# Patient Record
Sex: Female | Born: 1965 | Race: Black or African American | Hispanic: No
Health system: Northeastern US, Academic
[De-identification: ages and names within clinical notes are randomized; demographics above are authoritative.]

---

## 2019-10-07 ENCOUNTER — Telehealth: Admit: 2019-10-07 | Payer: PRIVATE HEALTH INSURANCE | Attending: Urology | Primary: Internal Medicine

## 2019-10-07 LAB — CYTOLOGY NONGYN CASES     (Q)

## 2019-10-07 NOTE — Other
Please let patient know urine cytology is negative. Thank you

## 2019-10-07 NOTE — Telephone Encounter
Called to relay below result but there was no answer. Unable to LM as VM box is full. ----- Message from [name], APRN sent at 10/07/2019  5:12 PM EST -----Please let patient know urine cytology is negative. Thank you

## 2019-10-08 ENCOUNTER — Telehealth: Admit: 2019-10-08 | Payer: PRIVATE HEALTH INSURANCE | Attending: Urology | Primary: Internal Medicine

## 2019-10-08 NOTE — Telephone Encounter
No answer mailbox is full.  Will call back later

## 2019-10-16 ENCOUNTER — Inpatient Hospital Stay: Admit: 2019-10-16 | Discharge: 2019-10-16 | Payer: MEDICAID | Primary: Internal Medicine

## 2019-10-16 DIAGNOSIS — R3129 Other microscopic hematuria: Secondary | ICD-10-CM

## 2019-10-16 MED ORDER — SODIUM CHLORIDE 0.9 % BOLUS (NEW BAG)
0.9 % | Freq: Once | INTRAVENOUS | Status: CP
Start: 2019-10-16 — End: ?
  Administered 2019-10-16: 23:00:00 0.9 mL/h via INTRAVENOUS

## 2019-10-16 MED ORDER — IOHEXOL 350 MG IODINE/ML INTRAVENOUS SOLUTION
350 mg iodine/mL | Freq: Once | INTRAVENOUS | Status: CP | PRN
Start: 2019-10-16 — End: ?
  Administered 2019-10-16: 23:00:00 350 mL via INTRAVENOUS

## 2019-10-18 NOTE — Other
Please let patient know [place] negative. Thank you

## 2019-10-19 ENCOUNTER — Telehealth: Admit: 2019-10-19 | Payer: PRIVATE HEALTH INSURANCE | Attending: Urology | Primary: Internal Medicine

## 2019-10-19 NOTE — Telephone Encounter
[name] contacted and results relayed. She verbalized understanding.

## 2019-10-19 NOTE — Telephone Encounter
-----   Message from [name], APRN sent at 10/18/2019  9:31 AM EST -----Please let patient know [place] negative. Thank you

## 2019-10-19 NOTE — Telephone Encounter
No answer. Left nondescript message to call office back. Will also try again later.

## 2019-10-26 ENCOUNTER — Encounter: Admit: 2019-10-26 | Payer: PRIVATE HEALTH INSURANCE | Attending: Urology | Primary: Internal Medicine

## 2019-10-26 ENCOUNTER — Encounter: Admit: 2019-10-26 | Payer: PRIVATE HEALTH INSURANCE | Primary: Internal Medicine

## 2019-10-26 ENCOUNTER — Ambulatory Visit: Admit: 2019-10-26 | Payer: MEDICAID | Attending: Urology | Primary: Internal Medicine

## 2019-10-26 DIAGNOSIS — K219 Gastro-esophageal reflux disease without esophagitis: Secondary | ICD-10-CM

## 2019-10-26 DIAGNOSIS — Z1231 Encounter for screening mammogram for malignant neoplasm of breast: Secondary | ICD-10-CM

## 2019-10-26 DIAGNOSIS — R3129 Other microscopic hematuria: Secondary | ICD-10-CM

## 2019-10-27 DIAGNOSIS — N399 Disorder of urinary system, unspecified: Secondary | ICD-10-CM

## 2019-11-02 ENCOUNTER — Inpatient Hospital Stay: Admit: 2019-11-02 | Discharge: 2019-11-02 | Payer: MEDICAID | Primary: Internal Medicine

## 2019-11-02 ENCOUNTER — Encounter: Admit: 2019-11-02 | Payer: PRIVATE HEALTH INSURANCE | Primary: Internal Medicine

## 2019-11-02 DIAGNOSIS — Z1231 Encounter for screening mammogram for malignant neoplasm of breast: Secondary | ICD-10-CM

## 2019-11-02 NOTE — Progress Notes
On 10-26-2019 while rooming Michelle Barron when I asked the question if she felt safe at home she stated yes but then started crying. I asked her if she was okay, and if she needed to speak to someone like a Arts development officer.Michelle Barron  stated yes and that she was talking to someone when she lived in [place]. I reported it to Gray Bernhardt who will reach out to our Child psychotherapist.

## 2019-11-02 NOTE — Progress Notes
Follow-up Note Michelle Barron is a 54 y.o. female is here for follow up with established diagnosis microhematuria.Pt reports no gross hematuria or flank pain.  Previously followed by Urologist in [place].  Dr Moise Boring.  No records available.  Pt moved here in August to be closer to daughter and is feeling socially isolated.  She watches her 56 year old grandson while daughter works. Pt would prefer to live with her daughter. She lives alone. She feels safe at home just lonely  Urine cytology: negativeCT nephrogram: no stones no hydro. Mild urinary bladder wall thickening is nonspecific HPIMaria  reports that she has never smoked. She has never used smokeless tobacco. She reports that she does not drink alcohol. No history on file for drug.  Past Medical History: Diagnosis Date ? GERD (gastroesophageal reflux disease)  Past Surgical History: Procedure Laterality Date ? APPENDECTOMY  20 yrs No Known AllergiesReview Of SystemsReview of SystemsReview of SystemsGeneral: Patient denies fevers, chills, sweats, loss of appetite, fatigue, malaise. Eyes: Patient denies blurring, double vision, vision loss, eye pain, light sensitivity. Ears/Nose/Throat: Patient denies earache, ringing in ears, decreased hearing, nasal congestion, difficulty swallowing. Cardiovascular: Patient denies chest pains, palpitations, fainting spells, shortness of breath, ankle swelling.  Respiratory: Patient denies cough, wheezing.  Skin: Patient denies rash, itching, suspicious lesions. GI: Patient denies nausea, vomiting, diarrhea, constipation, change in bowel habits, abdominal pain, bloody or black stools.  GU: See HPI Musculoskeletal: Patient denies back pain, joint pain, joint swelling, muscle cramps, muscle weakness, stiffness, arthritis. Neurological: Patient denies transient paralysis, weakness, numbness, tingling sensation, seizures, tremors, headaches.  Psychiatric: Patient denies depression, anxiety, memory loss, suicidal thoughts, hallucinations, paranoia. Endocrine: Patient denies cold intolerance, heat intolerance, increased thirst, increased appetite, large quantities of urine. Heme/Lymphatic: Patient denies abnormal bruising, bleeding, enlarged lymph nodes. Allergy/Immunologic: Patient denies persistent infections, HIV exposures. Physical ExamBP 125/82 (Site: r a, Position: Sitting, Cuff Size: X-Large)  - Pulse 68  - Temp 97.1 ?F (36.2 ?C) (Temporal)  - Ht 5' 1 (1.549 m)  - Wt 84.4 kg  - SpO2 99%  - BMI 35.14 kg/m? Lab Results Component Value Date  UCOLOR yellow 10/26/2019  UGLUCOSE Negative 10/26/2019  UKETONE Negative 10/26/2019  USPECGRAVITY 1.020 10/26/2019  UPROTEIN Negative 10/26/2019  UNITRATES Negative 10/26/2019  UBLOOD 2+ 10/26/2019  ULEUKOCYTES Negative 10/26/2019    No results found for: PVRPOCTPhysical ExamPhysical Exam Constitutional: oriented to person, place, and time.  appears well-developed and well-nourished. HENT: Head: Normocephalic. Eyes: No scleral icterus. Cardiovascular: Normal rate.  Pulmonary/Chest: Effort normal. Abdominal: Soft. Musculoskeletal: Normal range of motion. Lymphadenopathy:    no cervical adenopathy. Neurological: alert and oriented to person, place, and time. Skin: Skin is warm and dry. Psychiatric: Has a normal mood and affect. His behavior is normal. Judgment and thought content normal. Assessment & Plan:Michelle Barron is a 54 y.o. female Encounter Diagnoses Name SNOMED [place](R) Primary? ? Disease of urinary tract DISORDER OF URINARY TRACT Yes ? Microhematuria MICROSCOPIC HEMATURIA  Needs cysto to complete microhematuria work upSocial work referral [name], APRN

## 2019-11-05 ENCOUNTER — Encounter: Admit: 2019-11-05 | Payer: PRIVATE HEALTH INSURANCE | Primary: Internal Medicine

## 2019-11-05 NOTE — Progress Notes
SOCIAL WORK NOTEPatient Name: Michelle Barron Record Number: [id number] Date of Birth: [birth year]Social Work Follow Up    Most Recent Value Document Type  Progress Note Reason for Encounter  Lacks Resources Intervention  Distress Assessment & Resolution Distress Assessment & Resolution  Clarifying & Identifying Source of Information  Patient Record Reviewed  No Level of Care  Ambulatory Identified Clinical/Disposition, Issues/Barriers:  Emotional Support Intervention(s)/Summary  15 minuets telephone contact with Michelle Barron to discuss referral for emotional supports. She expressed that she was unable at this time but will call me back when she is available. Collaboration with Treatment Team/Community Providers/Family:  Medical team Outcome  Resolved Handoff Required?  No Barriers to Discharge  no barriers identified Next Steps/Plan (including hand-off):  Social Work intervention is complete at this time. Signature:  Edd Fabian, LMSW Contact Information:  [phone]

## 2019-11-23 ENCOUNTER — Encounter: Admit: 2019-11-23 | Payer: PRIVATE HEALTH INSURANCE | Primary: Internal Medicine

## 2019-11-23 NOTE — Progress Notes
SOCIAL WORK NOTEPatient Name: Michelle Barron Record Number: [id number] Date of Birth: 12-20-1967Social Work Follow Up    Most Recent Value Document Type  Progress Note Reason for Encounter  Psycho-Social Distress Source of Information  Patient Record Reviewed  No Level of Care  Ambulatory Identified Clinical/Disposition, Issues/Barriers:  Social Work Supports Intervention(s)/Summary  15 minuets telephone contact with Michelle Barron to discuss referral for emotional supports. She expressed that she is unavailable at this time and was informed to contact me when she is available. Collaboration with Treatment Team/Community Providers/Family:  Medical Team Outcome  Resolved Handoff Required?  No Barriers to Discharge  no barriers identified Next Steps/Plan (including hand-off):  Second attempt to speak with Michelle Barron. Additional attempt will contact Michelle Barron. Signature:  Edd Fabian, LMSW Contact Information:  [phone]  '

## 2019-12-09 ENCOUNTER — Ambulatory Visit: Admit: 2019-12-09 | Payer: MEDICAID | Attending: Urology | Primary: Internal Medicine

## 2019-12-09 ENCOUNTER — Encounter: Admit: 2019-12-09 | Payer: PRIVATE HEALTH INSURANCE | Attending: Urology | Primary: Internal Medicine

## 2019-12-09 ENCOUNTER — Telehealth: Admit: 2019-12-09 | Payer: PRIVATE HEALTH INSURANCE | Attending: Urology | Primary: Internal Medicine

## 2019-12-09 ENCOUNTER — Encounter
Admit: 2019-12-09 | Payer: PRIVATE HEALTH INSURANCE | Attending: Vascular and Interventional Radiology | Primary: Internal Medicine

## 2019-12-09 DIAGNOSIS — K219 Gastro-esophageal reflux disease without esophagitis: Secondary | ICD-10-CM

## 2019-12-09 NOTE — Telephone Encounter
Michelle Barron was in today to see Dr Mariana Single and asked that I contact you to get a hold of her.  She received a call from you and you two were unable to connect she would like to speak with you Please call her at her above number Thank you She states afternoons are best to reach her after 3:00 pm

## 2019-12-09 NOTE — Progress Notes
Michelle Barron is here today for a cystoscopy.  Patient informed of procedure and questions answered.  Instructions for pre procedure given to patient.  Written consent and time out obtained.  Patient prepped according to protocol.  Tolerated procedure well.  VSS.  Post procedure instructions reviewed with patient.

## 2019-12-09 NOTE — Patient Instructions
Cystoscopy, Care AfterRefer to this sheet in the next few weeks. These instructions provide you with information about caring for yourself after your procedure. Your health care provider may also give you more specific instructions. Your treatment has been planned according to current medical practices, but problems sometimes occur. Call your health care provider if you have any problems or questions after your procedure.What can I expect after the procedure?After the procedure, it is common to have:Mild pain when you urinate. Pain should stop within a few minutes after you urinate. This may last for up to 1 week.A small amount of blood in your urine for several days.Feeling like you need to urinate but producing only a small amount of urine.Follow these instructions at home:MedicinesTake over-the-counter and prescription medicines only as told by your health care provider.If you were prescribed an antibiotic medicine, take it as told by your health care provider. Do not stop taking the antibiotic even if you start to feel better.General instructionsReturn to your normal activities as told by your health care provider. Ask your health care provider what activities are safe for you.Do not drive for 24 hours if you received a sedative.Watch for any blood in your urine. If the amount of blood in your urine increases, call your health care provider.Follow instructions from your health care provider about eating or drinking restrictions.If a tissue sample was removed for testing (biopsy) during your procedure, it is your responsibility to get your test results. Ask your health care provider or the department performing the test when your results will be ready.Drink enough fluid to keep your urine clear or pale yellow.Keep all follow-up visits as told by your health care provider. This is important.Contact a health care provider if:You have pain that gets worse or does not get better with medicine, especially pain when you urinate.You have difficulty urinating.Get help right away if:You have more blood in your urine.You have blood clots in your urine.You have abdominal pain.You have a fever or chills.You are unable to urinate.This information is not intended to replace advice given to you by your health care provider. Make sure you discuss any questions you have with your health care provider.Document Released: 03/02/2005 Document Revised: 01/19/2016 Document Reviewed: 11/03/2016Elsevier Interactive Patient Education ? 2019 Elsevier Inc.

## 2019-12-09 NOTE — Progress Notes
HPIMaria Barron is a 54 y.o. female is here for follow up with established diagnosis microhematuria seen by Fransico Michael.?Pt reports no gross hematuria or flank pain.  Previously followed by Urologist in [place].  Dr Moise Boring.  No records available.  Pt moved here in August to be closer to daughter and is feeling socially isolated.  She watches her 41 year old grandson while daughter works. Pt would prefer to live with her daughter. She lives alone. She feels safe at home just lonely  ?Urine cytology: negativeCT nephrogram: no stones no hydro. Mild urinary bladder wall thickening is nonspecific ??She has never smoked. She has never used smokeless tobacco. She reports that she does not drink alcohol. No history on file for drug. She is here for cystoscopyReferred by Dr Kennieth Francois Past Medical HistoryPast Medical History: Diagnosis Date ? GERD (gastroesophageal reflux disease)  Past Surgical HistoryPast Surgical History: Procedure Laterality Date ? APPENDECTOMY  20 yrs AllergiesNo Known AllergiesMedicationsOutpatient Encounter Medications as of 12/09/2019 Medication Sig Dispense Refill ? tenofovir disoproxil (VIREAD) 300 mg tablet TAKE 1 TABLET BY MOUTH EVERY DAY   ? amoxicillin (AMOXIL) 500 MG capsule Take 500 mg by mouth 3 (three) times daily.   ? oxyCODONE-acetaminophen (PERCOCET) 10-650 mg per tablet Take 1 tablet by mouth every 6 (six) hours as needed.   No facility-administered encounter medications on file as of 12/09/2019.   Social HistorySocial History Tobacco Use ? Smoking status: Never Smoker ? Smokeless tobacco: Never Used Substance Use Topics ? Alcohol use: No ? Drug use: Not on file  Family History History reviewed. No pertinent family history.Review of SystemsNo change since 3/1/21General: Patient denies fevers, chills, sweats, loss of appetite, fatigue, malaise. Eyes: Patient denies blurring, double vision, vision loss, eye pain, light sensitivity. Ears/Nose/Throat: Patient denies earache, ringing in ears, decreased hearing, nasal congestion, difficulty swallowing. Cardiovascular: Patient denies chest pains, palpitations, fainting spells, shortness of breath, ankle swelling.  Respiratory: Patient denies cough, wheezing.  Skin: Patient denies rash, itching, suspicious lesions. GI: Patient denies nausea, vomiting, diarrhea, constipation, change in bowel habits, abdominal pain, bloody or black stools.  GU: See HPI Musculoskeletal: Patient denies back pain, joint pain, joint swelling, muscle cramps, muscle weakness, stiffness, arthritis. Neurological: Patient denies transient paralysis, weakness, numbness, tingling sensation, seizures, tremors, headaches.  Psychiatric: Patient denies depression, anxiety, memory loss, suicidal thoughts, hallucinations, paranoia. Endocrine: Patient denies cold intolerance, heat intolerance, increased thirst, increased appetite, large quantities of urine. Heme/Lymphatic: Patient denies abnormal bruising, bleeding, enlarged lymph nodes. Allergy/Immunologic: Patient denies persistent infections, HIV exposures. Physical ExamTemp 97.8 ?F (36.6 ?C) (Temporal)  - Ht 5' 1 (1.549 m)  - Wt 82.6 kg  - BMI 34.39 kg/m? Constitutional: Oriented to person, place, and time. Appears well-developed and well-nourished. Head: Normocephalic and atraumatic. Pulmonary/Chest: Effort normal. No wheezing.GU: normal meatusMusculoskeletal: Normal range of motion. Neurological: alert and oriented to person, place, and time. No focal deficits are evident. Skin: Skin is dry. Psychiatric: Has a normal mood and affect. Behavior is normal. Judgment and thought content normal.CystoscopyA time out was performed verifying patient and procedure to be performed.  Antibiotics were discussed.  The history, physical findings and indications for the procedure were reviewed prior to the procedure. A surgical consent was obtained. Antibiotic: noneAnesthesia:  NoneProcedure:  Patient was placed in dorsal lithotomy position and a 17 french flexible cystoscope was placed.  Sterile water was used for irrigation.Residual urine:  MinimalUrethral Meatus: NormalUrethra: No abnormalities detected aside from bladder neck polypsBladder:  Bladder showed a few glomerulations.  No  evidence of tumor or stones.  Normal expansion. Retroflex exam is normalTrigone:  Both orifices identified and are normal in configuration and location.The cystoscope was removed.  The patient tolerated the procedure well. Lab Results Component Value Date  UCOLOR yellow 12/09/2019  UGLUCOSE Negative 12/09/2019  UKETONE Negative 12/09/2019  USPECGRAVITY 1.015 12/09/2019  UPROTEIN Negative 12/09/2019  UNITRATES Negative 12/09/2019  UBLOOD 1+ 12/09/2019  ULEUKOCYTES Negative 12/09/2019   No results found for: PVRPOCTAssessment & Plan: Michelle Barron is a 54 y.o. female with microhematuria and negative workup. She will be observed.Encounter Diagnoses Name SNOMED [place](R) Primary? ? Microhematuria MICROSCOPIC HEMATURIA Yes Orders Placed This Encounter Procedures ? POC urinalysis manual w/o scope Return in about 1 year (around 12/08/2020) for follow up with [facility].Patient will let us know if any new problems or symptoms arise in the interim.

## 2019-12-09 NOTE — Progress Notes
Time Out Documentation?	Procedure Name: cystoscopy?	Procedure Time: 2:46 PM ?	Team Members Present: Isabella Bowens, Su Monks, MD?	Time Out initiated after patient positioned & prior to beginning of procedure: Yes?	Name of Patient  & MRUN # or DOB stated and matches ID band or previously confirmed med record number: Yes?	Proceduralist states or confirms procedure to be performed: Yes?	Procedural consent is used to verify procedure performed  & matches pt identifiers:Yes?	Site of procedure(s) (with laterality or level) is topically marked per policy & visible after draping:N/A ?	Final check completed on all specimens: not applicable?	Serial number on device: [id number]

## 2019-12-10 DIAGNOSIS — R3129 Other microscopic hematuria: Secondary | ICD-10-CM

## 2019-12-15 ENCOUNTER — Encounter: Admit: 2019-12-15 | Payer: PRIVATE HEALTH INSURANCE | Primary: Internal Medicine

## 2019-12-15 NOTE — Progress Notes
SOCIAL WORK NOTEPatient Name: Antia Rahal Record Number: [id number] Date of Birth: 11-19-1967Social Work Follow Up    Most Recent Value Document Type  Progress Note Reason for Encounter  Psycho-Social Distress Intervention  Resource Needs Assessment & Referral Resource Needs Assessment & Referral  Community Resources Source of Information  Patient Record Reviewed  No Level of Care  Ambulatory Identified Clinical/Disposition, Issues/Barriers:  Emotional Supports Intervention(s)/Summary  15 minuets telephone contact with Mr. A[place]il to discuss emotional supports.She expressed that she would like to speak to someone to address her anxiety. I discussed a list of mental health providers and it will be mailed to her home. She was informed that she can call the providers to schedule an appointment. She expressed an appreciation for the support and the appreciation. Collaboration with Treatment Team/Community Providers/Family:  Medical Team. Outcome  Resolved Handoff Required?  No Barriers to Discharge  no barriers identified Next Steps/Plan (including hand-off):  Social Work intervention is complete Signature:  Edd Fabian, LMSW Contact Information:  [phone]

## 2020-01-02 ENCOUNTER — Inpatient Hospital Stay: Admit: 2020-01-02 | Discharge: 2020-01-03 | Payer: MEDICAID

## 2020-01-02 ENCOUNTER — Emergency Department: Admit: 2020-01-02 | Payer: PRIVATE HEALTH INSURANCE | Primary: Internal Medicine

## 2020-01-02 DIAGNOSIS — R110 Nausea: Secondary | ICD-10-CM

## 2020-01-02 DIAGNOSIS — R519 Headache, unspecified: Secondary | ICD-10-CM

## 2020-01-02 MED ORDER — SODIUM CHLORIDE 0.9 % BOLUS (NEW BAG)
0.9 % | INTRAVENOUS | Status: CP
Start: 2020-01-02 — End: ?
  Administered 2020-01-03: 01:00:00 0.9 mL/h via INTRAVENOUS

## 2020-01-02 MED ORDER — MECLIZINE 25 MG TABLET
25 mg | Freq: Once | ORAL | Status: CP
Start: 2020-01-02 — End: ?
  Administered 2020-01-03: 01:00:00 25 mg via ORAL

## 2020-01-03 ENCOUNTER — Encounter: Admit: 2020-01-03 | Payer: PRIVATE HEALTH INSURANCE | Attending: Psychiatry | Primary: Internal Medicine

## 2020-01-03 DIAGNOSIS — Z79899 Other long term (current) drug therapy: Secondary | ICD-10-CM

## 2020-01-03 DIAGNOSIS — K219 Gastro-esophageal reflux disease without esophagitis: Secondary | ICD-10-CM

## 2020-01-03 DIAGNOSIS — M25552 Pain in left hip: Secondary | ICD-10-CM

## 2020-01-03 DIAGNOSIS — R45851 Suicidal ideations: Secondary | ICD-10-CM

## 2020-01-03 DIAGNOSIS — R42 Dizziness and giddiness: Secondary | ICD-10-CM

## 2020-01-03 LAB — CBC WITH AUTO DIFFERENTIAL
BKR WAM ABSOLUTE IMMATURE GRANULOCYTES: 0 x 1000/ÂµL (ref 0.0–0.3)
BKR WAM ABSOLUTE LYMPHOCYTE COUNT: 4.4 x 1000/ÂµL — ABNORMAL HIGH (ref 1.0–4.0)
BKR WAM ABSOLUTE NRBC: 0 x 1000/ÂµL (ref 0.0–0.0)
BKR WAM ANALYZER ANC: 3.9 x 1000/ÂµL (ref 1.0–11.0)
BKR WAM BASOPHIL ABSOLUTE COUNT: 0.1 x 1000/ÂµL — ABNORMAL HIGH (ref 0.0–0.0)
BKR WAM BASOPHILS: 0.5 % (ref 0.0–4.0)
BKR WAM EOSINOPHIL ABSOLUTE COUNT: 0.2 x 1000/ÂµL (ref 0.0–1.0)
BKR WAM EOSINOPHILS: 2.5 % (ref 0.0–7.0)
BKR WAM HEMATOCRIT: 41.8 % (ref 37.0–52.0)
BKR WAM HEMOGLOBIN: 13.4 g/dL (ref 12.0–18.0)
BKR WAM IMMATURE GRANULOCYTES: 0.2 % (ref 0.0–3.0)
BKR WAM LYMPHOCYTES: 47.2 % (ref 8.0–49.0)
BKR WAM MCH (PG): 28.3 pg (ref 27.0–31.0)
BKR WAM MCHC: 32.1 g/dL (ref 31.0–36.0)
BKR WAM MCV: 88.2 fL (ref 78.0–94.0)
BKR WAM MONOCYTE ABSOLUTE COUNT: 0.7 x 1000/ÂµL (ref 0.0–2.0)
BKR WAM MONOCYTES: 7.9 % (ref 4.0–15.0)
BKR WAM MPV: 9.7 fL (ref 6.0–11.0)
BKR WAM NEUTROPHILS: 41.7 % (ref 37.0–84.0)
BKR WAM NUCLEATED RED BLOOD CELLS: 0 % (ref 0.0–1.0)
BKR WAM PLATELETS: 373 x1000/ÂµL (ref 140–440)
BKR WAM RDW-CV: 13.7 % (ref 11.5–14.5)
BKR WAM RED BLOOD CELL COUNT: 4.7 M/ÂµL (ref 3.8–5.9)
BKR WAM WHITE BLOOD CELL COUNT: 9.3 x1000/ÂµL (ref 4.0–10.0)

## 2020-01-03 LAB — BASIC METABOLIC PANEL
BKR ANION GAP: 12 (ref 7–17)
BKR BLOOD UREA NITROGEN: 12 mg/dL (ref 6–20)
BKR BUN / CREAT RATIO: 13.2 (ref 8.0–23.0)
BKR CALCIUM: 9.7 mg/dL (ref 8.8–10.2)
BKR CHLORIDE: 105 mmol/L (ref 98–107)
BKR CO2: 24 mmol/L (ref 20–30)
BKR CREATININE: 0.91 mg/dL (ref 0.40–1.30)
BKR EGFR (AFR AMER): >60 mL/min/{1.73_m2} (ref >60)
BKR EGFR (NON AFRICAN AMERICAN): >60 mL/min/{1.73_m2} (ref >60)
BKR GLUCOSE: 92 mg/dL (ref 70–100)
BKR POTASSIUM: 4.7 mmol/L (ref 3.3–5.1)
BKR SODIUM: 141 mmol/L (ref 136–144)

## 2020-01-03 NOTE — ED Notes
20:10 Pt here for vertigo, fell 2 days, left hip 7/10 pain. EKG done in triage.  Pt feeling nauseated today. Pt reports she has anxiety. 20:12 Provider at bedside. 20:30 Dr Jomarie Longs at bedside doing neuro exam. 20g IV placed RAC. Basic labs drawn and sent. IVF started.20:41 To XR, will move to room B9 upon return, all belongings on pt bed. 21:05 Pt back from XR tom room B9, sitter at bedside, security called for safety check. Pt advised of need for safety check, became tearful, sayingI'm ok now, I'm ok, it's ok, I don't need that. Explained that since she told us that several times, we needed to keep her safe. Pt acknowledged this. 21:30 Pt ambulated to BR. C/o slightly dizzy, steady gait. Stand-by assist. 21:45 Security dealing with other pts, now here for safety check, pt became very scared and tearful, kept saying, I didn't do anything wrong! Belongings placed in pt bag and in cabinet with label. Pt allowed to keep clothes on, no strings. Purse sweater and shoes in belongings bag. 21:55 IVF done. Awaiting psych. 22:55 Pt sitting quietly in room, unable to give urine sample now since pt just went to BR earlier. 23:20 Psych provider at bedside. 00:17 RN d/w psych provider pt was going to go to [place], but after talking to daughter, psych is dc'ing pt. Pt ready to go. Paperwork given. IV dc'd intact, belongings returned to pt. Orders for COVID swab and U-tox cancelled.00:23  Pt verbalized understanding, and ambulated w/o difficulty. VSS.

## 2020-01-03 NOTE — Other
HistoryChief Complaint Patient presents with ? Dizziness   Pt reprots dizziness when she bends over or turns her head quickly, no known hx of vertigo. States this has been going on for 4 days with intermittent headaches. REports feel anxious regarding these symptoms. Denies CP, SOB, fever/chills.  Ms. Michelle Barron is a 54 y.o. female w/ pmhx significant for Hep B, GERD, vision loss, depression, and anxiety presents to the ED due to dizziness x 4 day. Patient reports that she only experiences symptoms when she bends down or turns her head, episodes last minutes w/ variance. Patient reports she feels the room spins around her when the episodes occur, today she came to the ED as she now begun to experience nausea with these episodes. 2 days ago patient fell due to dizziness, landing on her left hip, she continues to experience 7/10 pain but is able to ambulate and weight bear. Patient denies having had these symptoms before. Patient reports medication adherence top tenofivir, recently started taking daily Vit D supplement. Patient denies fever, vomiting, chest pain, and difficulty breathing.During safety evaluation, patient became tearful when asked about SI, reported current SI, denies HI. Patient previously was on an anxiolytic and seeing a therapist regularly in [place], moved to the area 7 months ago and has not found a new provider.  Past Medical History: Diagnosis Date ? GERD (gastroesophageal reflux disease)  Past Surgical History: Procedure Laterality Date ? APPENDECTOMY  22 yrs No family history on file.Social History Socioeconomic History ? Marital status: Divorced   Spouse name: Not on file ? Number of children: Not on file ? Years of education: Not on file ? Highest education level: Not on file Tobacco Use ? Smoking status: Never Smoker ? Smokeless tobacco: Never Used Substance and Sexual Activity ? Alcohol use: No ED Other Social History ? E-cigarette status Never User  ? E-Cigarette Use Never User  E-cigarette/Vaping Substances ? Nicotine No  ? THC No  ? CBD No  ? Flavoring No  E-cigarette/Vaping Devices ? Disposable No  ? Pre-filled or Refillable Cartridge No  ? Refillable Tank No  ? Pre-filled Pod No  Review of Systems Constitutional: Negative for fever. HENT: Negative for ear pain, sore throat and tinnitus.  Eyes: Positive for visual disturbance.      Reports the room spins, denies double vision and blurry vision. Respiratory: Negative for chest tightness and shortness of breath.  Cardiovascular: Positive for palpitations. Negative for chest pain. Gastrointestinal: Positive for nausea. Negative for abdominal pain, constipation, diarrhea and vomiting. Genitourinary: Negative for dysuria. Musculoskeletal: Positive for arthralgias. Neurological: Positive for dizziness and headaches. Negative for syncope. Psychiatric/Behavioral: Positive for suicidal ideas. Negative for agitation. The patient is nervous/anxious.   Physical ExamED Triage Vitals [01/02/20 1919]BP: 130/85Pulse: (!) 95Pulse from  O2 sat: n/aResp: 16Temp: 98.2 ?F (36.8 ?C)Temp src: OralSpO2: 99 % BP 130/85  - Pulse (!) 95  - Temp 98.2 ?F (36.8 ?C) (Oral)  - Resp 16  - Wt 81.2 kg  - SpO2 99%  - BMI 33.82 kg/m? Physical ExamConstitutional:     General: She is not in acute distress.   Appearance: Normal appearance. HENT:    Head: Normocephalic and atraumatic.    Right Ear: Tympanic membrane, ear canal and external ear normal.    Left Ear: Tympanic membrane and ear canal normal. Eyes:    Extraocular Movements: Extraocular movements intact.    Right eye: No nystagmus.    Left eye: No nystagmus.    Pupils: Pupils are equal, round,  and reactive to light.    Comments: Arcus senilis bilaterally.  Neck:    Musculoskeletal: No neck rigidity or muscular tenderness. Cardiovascular:    Rate and Rhythm: Normal rate and regular rhythm.    Pulses: Normal pulses.    Heart sounds: Normal heart sounds. Pulmonary:    Effort: Pulmonary effort is normal.    Breath sounds: Normal breath sounds. Abdominal:    General: Abdomen is flat.    Palpations: Abdomen is soft.    Tenderness: There is no abdominal tenderness. Musculoskeletal:    Left hip: She exhibits decreased range of motion and tenderness. Skin:   General: Skin is warm and dry. Neurological:    General: No focal deficit present.    Mental Status: She is alert and oriented to person, place, and time. Psychiatric:       Mood and Affect: Affect is tearful.       Thought Content: Thought content includes suicidal ideation. Thought content does not include homicidal ideation.  Procedures ED COURSEPatient Reevaluation: Plan and Assessment Ms. Michelle Barron is a 54 y.o. female w/ pmhx significant for Hep B, GERD, vision loss, depression, and anxiety presents to the ED due to dizziness x 4 day. Current issues include dizziness, nausea, and current SI. Patient is tachycardic at 96 bpm, remains VSS, BP mildly elevated at 130/85, afebrile at 98.2 F, respirations stable at 16, stating 99%. Patient is tearful, in NAD.ZOX:[id number] vertigo: BPPV vs. vestibular neuritis vs. meniere disease Central vertigo : cerebral ischemia vs. cerebral hemorrhage vs. vestibular migraine Orthostatic hypotension vs. Dehydration vs. Metabolic abnormality vs. Arrhythmia Hip contusion vs. Fracture (unlikely as weightbearing)Active SIPlan is to obtain ECG, CBC, CMP, and x-ray ofd the left hip. Plan to perform Dix-Hallpike, if positive, will perform Epley maneuver.Will start on IVF and order meclizine. Once medically cleared, will have Psych consult.Case discussed with Dr. Corliss Parish and Sammuel Cooper, PA-CED Course7:45PMEKG WNL8:40PMMeclizine administered8:45PMPatient to radiology. Imaging insignificant for fx. 9:00PMDix-hallpike performed, positive, indicative of BPPV as dx. Eppley maneuver performed. Appears successful, will follow.10:00PMCBC insignificant in findings, CMP pending.Patient continues to fell well s/p Eppley maneuver. Educated patient that Vit D is likely not cause of new symptoms, is actually known to help prevent BPPV.10:25PMCMP insignificant in findings. Cleared for psych.10:30PMPatient continues to deny symptoms of vertigo since [facility] was performed.Patient progress: stableClinical Impressions as of Jan 01 2153 Dizziness Suicidal ideations  ED DispositionPatient medically cleared for psychiatric consultation in setting of SI.Signed:Jenevieve Kirschbaum Alphonzo Severance on MHB9:58PM5/03/2020

## 2020-01-03 NOTE — Other
[facility] EMERGENCY DEPARTMENT	Emergency Department Psychiatric Evaluation5/9/2021Location of Evaluation (ex: [facility], [facility] Crisis):  Michelle Barron is a 54 y.o., Divorced, female.PRESENTATION HISTORY Referred by:  SelfPatient seen in consultation at the request of:  Medical Team of [facility] ERTransported from:  HomeLegal Status on Arrival:  NoneSource of Information:  Patient and FamilyChief Complaint:  DepressionHPI:  Pt is a 54 yrs old with history of depression.  Moved from [facility] approximately 7 months ago.  Not connected with mental health treatment.  Came to ER bc of feeling dizzy.  Reported symptoms of depression and chronic/intermitted passive SI.  Said not to having passive SI in the past days because my daughter came from school to stay with me.  No history of suicide attempts. Interested in treatment.  REports depressive symptomatology is related with somatic complaints.  No HI.  No perceptual disturbances.Collateral Information:  Talked with her daughter Michelle Barron; [phone]).  No acute safety concerns  [place] Spaniel pt is sometimes depressed and confirmed this could be related with somatic complaints.  Confirmed his sister just came home with mother from [place] and is going to be with her.  Writer gave her number for [facility].Associated Signs and Symptoms:  No alcohol and no drug use.  Timing:  chronicSeverity:  mild and moderateModifying Factors:  Social IsolationAccess to Firearms:  NoREVIEW OF SYSTEMS Review of Systems Psychiatric/Behavioral: Positive for dysphoric mood and suicidal ideas. All other systems reviewed and are negative.OUTPATIENT MEDICATIONS Current Outpatient Medications Medication Sig ? amoxicillin Take 500 mg by mouth 3 (three) times daily. ? oxyCODONE-acetaminophen Take 1 tablet by mouth every 6 (six) hours as needed. ? tenofovir disoproxil TAKE 1 TABLET BY MOUTH EVERY DAY Medication Comments:  Not taking any psychiatric medicationsHEALTH ISSUES / MEDICAL HISTORY / ALLERGIES Past Medical History: Diagnosis Date ? GERD (gastroesophageal reflux disease)  OB History No obstetric history on file. Past Surgical History: Procedure Laterality Date ? APPENDECTOMY  20 yrs Patient has no known allergies.PSYCHIATRIC  TREATMENT HISTORY Psychiatric Treatment History ? Inpatient Hospitalization No  No data recordedSOCIAL HISTORY Social History Socioeconomic History ? Marital status: Divorced   Spouse name: Not on file ? Number of children: Not on file ? Years of education: Not on file ? Highest education level: Not on file Tobacco Use ? Smoking status: Never Smoker ? Smokeless tobacco: Never Used Substance and Sexual Activity ? Alcohol use: No (If you would like to add more detailed Developmental and Educational information, you can utilizethe Smartphrase PSYSOCHXADULT or PSYSOCHXPED within the History/Social Note section of thepatients chart to save it at the patient level, or add it directly here within this evaluation)SUBSTANCE ABUSE HISTORY FAMILY HISTORY No family history on file.LABORATORY/DIAGNOSTIC RESULTS Recent Results (from the past 24 hour(s)) EKG  Collection Time: 01/02/20  7:22 PM Result Value Ref Range  ECG - HEART RATE 81 bpm  ECG - QRS Interval 88 ms  ECG - QT Interval 376 ms  ECG - QTC Interval 437 ms  ECG - P Axis 33 deg  ECG - QRS Axis 21 deg  ECG - T Wave Axis 16 deg  ECG -- P-R Interval 149 msec  ECG - SEVERITY Normal ECG severity Basic metabolic panel  Collection Time: 01/02/20  8:32 PM Result Value Ref Range  Sodium 141 136 - 144 mmol/L  Potassium 4.7 3.3 - 5.1 mmol/L  Chloride 105 98 - 107 mmol/L  CO2 24 20 - 30 mmol/L  Anion Gap 12 7 - 17  Glucose 92 70 - 100 mg/dL  BUN 12 6 - 20 mg/dL  Creatinine 9.60 4.54 - 1.30 mg/dL  Calcium 9.7 8.8 - 09.8 mg/dL  BUN/Creatinine Ratio 11.9 8.0 - 23.0  eGFR (Afr Amer) >60 >60 mL/min/1.61m2  eGFR (NON African-American) >60 >60 mL/min/1.78m2 CBC auto differential  Collection Time: 01/02/20  8:32 PM Result Value Ref Range  WBC 9.3 4.0 - 10.0 x1000/?L  RBC 4.7 3.8 - 5.9 M/?L  Hemoglobin 13.4 12.0 - 18.0 g/dL  Hematocrit 14.7 82.9 - 52.0 %  MCV 88.2 78.0 - 94.0 fL  MCHC 32.1 31.0 - 36.0 g/dL  RDW-CV 56.2 13.0 - 86.5 %  Platelets 373 140 - 440 x1000/?L  MPV 9.7 6.0 - 11.0 fL  ANC (Abs Neutrophil Count) 3.9 1.0 - 11.0 x 1000/?L  Neutrophils 41.7 37.0 - 84.0 %  Lymphocytes 47.2 8.0 - 49.0 %  Absolute Lymphocyte Count 4.4 (H) 1.0 - 4.0 x 1000/?L  Monocytes 7.9 4.0 - 15.0 %  Monocyte Absolute Count 0.7 0.0 - 2.0 x 1000/?L  Eosinophils 2.5 0.0 - 7.0 %  Eosinophil Absolute Count 0.2 0.0 - 1.0 x 1000/?L  Basophil 0.5 0.0 - 4.0 %  Basophil Absolute Count 0.1 (H) 0.0 - 0.0 x 1000/?L  Immature Granulocytes 0.2 0.0 - 3.0 %  Absolute Immature Granulocyte Count 0.0 0.0 - 0.3 x 1000/?L  nRBC 0.0 0.0 - 1.0 %  Absolute nRBC 0.0 0.0 - 0.0 x 1000/?L  MCH 28.3 27.0 - 31.0 pg Relevant laboratory/medical data (from the past 24 hours) includes:  Pending Urine ToxPHYSICAL EXAM Vitals:  01/03/20 0029 BP: 137/87 Pulse: 82 Resp: 18 Temp: 98.1 ?F (36.7 ?C) Physical Exam Constitutional: She is oriented to person, place, and time. She appears well-nourished. HENT: Head: Normocephalic and atraumatic. Eyes: Pupils are equal, round, and reactive to light. Conjunctivae and EOM are normal. Neck: Normal range of motion. Neck supple. Cardiovascular: Normal rate, regular rhythm and normal heart sounds. Pulmonary/Chest: Effort normal and breath sounds normal. Abdominal: Soft. Bowel sounds are normal. Musculoskeletal: Normal range of motion. Neurological: She is alert and oriented to person, place, and time. Skin: Skin is warm. Nursing note and vitals reviewed.I have reviewed the physical exam/work-up performed by [facility] ERThe patient has been medically cleared by Medical Team of [facility] EXAMINATION General AppearanceHabitus:  HeavyGrooming:  FairMusculoskeletalStrength and Tone: Strength normalGait and Station: Stable posture and stable gaitPsychiatricAttitude: CooperativePsychomotor Behavior: No psychomotor activation or retardationSpeech: normal rate, volume and prosodyMood: CalmAffect: Congruent to reported moodThought Process: Coherent, logical, goal directedAssociations: NormalThought Content: NormalSuicidal Ideation: No current suicidal plan, ideation or intentHomicidal Ideation: No current homicidal ideation, plan or intentJudgment:  FairInsight:  FairCognitive EvaluationOrientation: Oriented to person, oriented to place and oriented to date/time Attention and Concentration:  Normal attention and concentrationMemory: Recall 3 out of 3 items in 5 minutesLanguage:  3 of 3 objectsFund of Knowledge:  NormalAbstract Reasoning: Normal capacity for abstract reasoningSAFETY AND RISK ASSESSMENT [place] - Suicide Severity Screen    Most Recent Value Have you wished you were dead or wished you could go to sleep and not wake up?  Yes Have you actually had any thoughts of killing yourself?   Yes Have you ever done anything, started to do anything, or prepared to do anything to end your life?   No [place] Suicide Risk Level  Low Risk  PSY RISK ASSESSMENT SAFE-T WITH C-SSRS  Reason for Assessment:  Psychiatric Emergency Department EvaluationC-SSRS:   WISH TO BE DEAD:  Yes  CURRENT SUICIDAL  THOUGHTS:  No  Have you ever done anything, started to do anything or prepared to do anything to end your life?:  NoSuicidal Ideation Intensity :   FREQUENCY:  Less than once a week  DURATION:  Less than 1 hour / Some of the time  CONTROLLABILITY:  Easily able to control thoughts  DETERRENTS:  Deterrents definitely stopped you from attempting suicide  REASONS FOR IDEATION:  Does not applyRisk Assessment:   Access to Lethal Methods:  NoCurrent and Past Psychiatric Diagnoses:    Mood Disorder:  Recurrent/Current  Psychotic Disorder:  No  Alcohol/Substance Abuse Disorders:  No  PTSD:  No  ADHD:  No  TBI:  No  Cluster Michelle Personality Disorders or Traits:  No  Conduct Problems:  No  Suicide Attempt:  No Prior Attempts  Presenting Symptoms:  Anhedonia:  Yes  Family History:   Suicide:  No  Suicidal Behavior:  No  Axis I Psychiatric Diagnosis requiring hospitalization:  No  Precipitants / Stressors:   Social Isolation:  Yes  Change in Treatment:    Recent inpatient discharge:  No  Not receiving treatment:  YesCurrent and Past Psychiatric Diagnoses:  Mood Disorder:  Recurrent/Current  Psychotic Disorder:  No  Alcohol/Substance Abuse Disorders:  No  PTSD:  No  ADHD:  No  TBI:  No  Cluster Michelle Personality Disorders or Traits:  No  Conduct Problems:  No  Suicide Attempt:  No Prior AttemptsProtective Factors:   Internal:   Fear of death or the actual act of killing self:  Yes  External:   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 - Self-Injurious Behavior:   Current Urges to harm Self:  No  Recent Self-Injury:  No  History of Self Injury:  No  Attitude Regarding Self Injury:  Ego dystonic  Imminent Risk for Self Injury in Community:  Low  Imminent Risk for Self Injury in Facility:  LowRisk to Others:   Current Agitation:  No  Homicidal/Aggressive Ideation:  No  Homicidal/Aggressive Threat/Plan:  No  Recent Violence/Aggression:  No  Attitude Regarding Aggression / Violence:  Ego dystonic  Imminent Risk for Violence in Community:  Low  Imminent Risk for Violence in Facility:  LowWithdrawal Risk:   Alcohol/benzo withdrawal risk factors: NO alcohol use.     Alcohol/Benzodiazepine/Barbiturate Risk for Withdrawal:  Absent  Opioids:  Not applicable     Opioid Risk for Withdrawal:  AbsentIMPRESSION No acute safety concernsPt without passive SI and without passive SI.  No HI.  No perceptual disturbances.  Does have symptoms of depression but is treatment oriented.  Symptoms could be related with isolation (I.e., immigrated from [place]).  Reported feeling pleased/happy bc his daughter just came from [facility] and will be with her for several weeks.  Collateral information without acute safety concerns.  Pt interested in [facility].  Pt also interested in being discharged.  No data supporting for Pt to be held against her will.  Gave IOP number to his daughter but also left message at [facility] referring her.  DIAGNOSTIC ASSESSMENT AND PLAN Active Hospital Problems  Diagnosis ? Depression [F32.9] GAF:  60 Legal Status Post Evaluation:  No Legal Status (Observation, Re-Evaluation, Continue on PEER or Discharge)Disposition Information/Plan:  Appropriate for outpatient treatment Dwaine Deter, MD05/09/21 1610

## 2020-01-03 NOTE — Discharge Instructions
Call [facility] 911 if ideas of harming yourself

## 2020-01-04 ENCOUNTER — Telehealth: Admit: 2020-01-04 | Payer: PRIVATE HEALTH INSURANCE | Attending: Clinical | Primary: Internal Medicine

## 2020-01-04 NOTE — Telephone Encounter
Rec'd referral to IOP from ED after hours.  This Clinical research associate spoke w/ Pt, discussed IOP.  Pt stated that she works full time and would be unable to participate in IOP.  Provided Pt with phone numbers of other mental health resources.  Pt expressed gratitude and reported she will follow up with them.

## 2020-01-08 NOTE — ED Provider Notes
HistoryChief Complaint Patient presents with ? Dizziness   Pt reprots dizziness when she bends over or turns her head quickly, no known hx of vertigo. States this has been going on for 4 days with intermittent headaches. REports feel anxious regarding these symptoms. Denies CP, SOB, fever/chills. ? Headache- New Onset Or New Symptoms ? Nausea ? Hip Pain ? Suicidal  The patient is a 54 y/o female with PMHx of GERD who presents to the ED for evaluation of intermittent dizziness x 4 days.  The patient reports that for the past 4 days if she bends over quickly or moves her head too fast she has seconds to minutes of a spinning sensation which then self-resolves.  Today she had associated nausea at 1 point which prompted her to finally present for evaluation.  She denies having any chest pain, shortness breath, abdominal pain, vomiting, diarrhea 1 week, fever, sweats, chills.  Of note she did fall during an incident 2 days prior to arrival and complains of pain in her left hip while ambulating.  She also reports she recently lost touch with her psychiatrist and has been feeling suicidal and thinking about killing herself. Denies HI, AH, VH. The history is provided by the patient. OtherThis is a new problem. The current episode started less than 1 hour ago. The problem occurs rarely. The problem has not changed since onset.Pertinent negatives include no chest pain, no abdominal pain, no headaches and no shortness of breath. Nothing aggravates the symptoms. Nothing relieves the symptoms. She has tried nothing for the symptoms. The treatment provided no relief.  Past Medical History: Diagnosis Date ? GERD (gastroesophageal reflux disease)  Past Surgical History: Procedure Laterality Date ? APPENDECTOMY  20 yrs No family history on file.Social History Socioeconomic History ? Marital status: Divorced   Spouse name: Not on file ? Number of children: Not on file ? Years of education: Not on file ? Highest education level: Not on file Tobacco Use ? Smoking status: Never Smoker ? Smokeless tobacco: Never Used Substance and Sexual Activity ? Alcohol use: No ED Other Social History ? E-cigarette status Never User  ? E-Cigarette Use Never User  E-cigarette/Vaping Substances ? Nicotine No  ? THC No  ? CBD No  ? Flavoring No  E-cigarette/Vaping Devices ? Disposable No  ? Pre-filled or Refillable Cartridge No  ? Refillable Tank No  ? Pre-filled Pod No  Review of Systems Constitutional: Negative for activity change, appetite change, chills, diaphoresis, fatigue, fever and unexpected weight change. HENT: Negative for congestion, sinus pain, sneezing and sore throat.  Respiratory: Negative for cough, chest tightness, shortness of breath and wheezing.  Cardiovascular: Negative for chest pain, palpitations and leg swelling. Gastrointestinal: Negative for abdominal distention, abdominal pain, anal bleeding, blood in stool, constipation, diarrhea, nausea and vomiting. Genitourinary: Negative for decreased urine volume, difficulty urinating, dysuria, flank pain, frequency, hematuria, pelvic pain and urgency. Musculoskeletal: Positive for arthralgias. Negative for back pain. Skin: Negative for color change, rash and wound. Neurological: Positive for dizziness. Negative for syncope, weakness, light-headedness, numbness and headaches. Psychiatric/Behavioral: Positive for suicidal ideas. All other systems reviewed and are negative. Physical ExamED Triage Vitals [01/02/20 1919]BP: 130/85Pulse: (!) 95Pulse from  O2 sat: n/aResp: 16Temp: 98.2 ?F (36.8 ?C)Temp src: OralSpO2: 99 % BP 137/87  - Pulse 82  - Temp 98.1 ?F (36.7 ?C) (Oral)  - Resp 18  - Wt 81.2 kg  - SpO2 100%  - BMI 33.82 kg/m? Physical ExamVitals signs and nursing note reviewed. Constitutional:  General: She is not in acute distress.   Appearance: She is well-developed. She is not diaphoretic. HENT:    Head: Normocephalic and atraumatic.    Nose: Nose normal. Eyes:    Conjunctiva/sclera: Conjunctivae normal. Neck:    Musculoskeletal: Normal range of motion and neck supple. Cardiovascular:    Rate and Rhythm: Normal rate and regular rhythm.    Heart sounds: Normal heart sounds. No murmur. No friction rub. No gallop.  Pulmonary:    Effort: Pulmonary effort is normal. No respiratory distress.    Breath sounds: Normal breath sounds. No wheezing or rales. Chest:    Chest wall: No tenderness. Abdominal:    General: Bowel sounds are normal. There is no distension.    Palpations: Abdomen is soft.    Tenderness: There is no abdominal tenderness. There is no guarding or rebound. Musculoskeletal:       General: No tenderness (Left hip) or deformity.    Comments: Able to ambulate. Sensation and distal pulses intact.  Skin:   General: Skin is warm and dry.    Capillary Refill: Capillary refill takes less than 2 seconds.    Findings: No rash. Neurological:    General: No focal deficit present.    Mental Status: She is alert and oriented to person, place, and time.    Comments: Dix-Hallpike maneuver with + resulting vertiginous symptoms that resolved per report within 15 seconds Psychiatric:       Mood and Affect: Mood normal.  ProceduresProceduresResident/APP ZOX:WRUEAVWUJW & PlanAssessment:? Mazella Deen  is a 54 y.o. female?with PMHx of GERD who presents to the ED for evaluation of intermittent dizziness x 4 days.?Exam as above, significant for?non-toxic but tearful 54 y/o female in NAD. Of note BP 130/85  - Pulse (!) 95  - Temp 98.2 ?F (36.8 ?C) (Oral)  - Resp 16  - Wt 81.2 kg  - SpO2 99%  - BMI 33.82 kg/m?  Cardiac RRR, lungs CTAB, abdomen soft, not TTP. Left hip TTP. Dix-Hallpike maneuver with + resulting vertiginous symptoms that resolved per report within 15 seconds?DDx:?vertigo/BPPV, orthostasis, anemia, dehydration, metabolic derangement, electrolyte disturbance, doubt CVA, contusion, doubt hip fx, suicidal?Plan: CBCBMPEKGUpregTrial meclizine/fluidsXray L hipClear for CIU?Results: -EKG unremarkable+Dix-Hallpike839 Patient received meds8:44 PM Patient to xray?XR Hip Left Final Result  Impression:    No acute fracture or dislocation.     Report Initiated By:  Alfonso Ellis, MD    Reported And Signed By: Madie Reno, MD Labs pending. Attempted epley maneuver, appears to have been successful, will continue to monitor. 9:49 PM CBC without anemia. Pending BMP. Cleared for psychiatric evaluation given reports of SI. ?Case discussed with Dr.Joseph??Sammuel Cooper, PA-C    --------------------------------------------------------D/W Attending Provider: Corliss Parish, MD [phone] acute issues/Distress2155 Signout Received. Awaiting BMP results - Clearance for CIU.BMP - No acute findingsCIU Eval - Medically cleared at this time for psych evaluation. No need for further medical workup at this time - No acute active medical issues. Needs psych eval due to reported SI.Tx to CIUChristopher Dill PA-C (([phone])ED COURSEPatient Reevaluation: ED Attestation: PA/APRNFace to face evaluation was performed by me in collaboration with the Advanced Practice Provider to assess for significant health threats.54 yo female here with vertigo.  Reports when she moves her head suddenly she experiences room spinning sensation that then resolves.  Had a fall yesterday as a result with some resulting left hip painOn my exam:Gen: alert, pleasant and appropriateNeuro: no facial asymmetry or focal deficits.  5/5 strength and SILT all 4 extrem.  No dysmetria/dysdiadochokinesiaHEENT: TMs  clear, neck supple, oropharynx clearCV: RRRResp: no resp distress or accessory muscle use.  Breathing comfortablyExt: WWP, mild pain with ROM of left hip.  Otherwise NTTP throughoutA/P:#Vertigo: c/w BPPV.  Plan epley, supportive care.  No exam e/o central vertigo#Hip pain: ambulatory.  Plan XR#SI - patient endorsed SI on ROS.  Will clear for CIU after above workup.  Daniel JosephClinical Impressions as of Jan 07 1746 Dizziness Suicidal ideations  ED DispositionDischarge - CIU Corliss Parish, MD05/08/21 2204 Otilio Carpen, PA05/08/21 2207 Isabel Caprice, PA05/14/21 1747

## 2020-04-27 ENCOUNTER — Ambulatory Visit: Admit: 2020-04-27 | Payer: PRIVATE HEALTH INSURANCE | Primary: Internal Medicine

## 2020-07-27 ENCOUNTER — Ambulatory Visit: Admit: 2020-07-27 | Payer: MEDICAID | Attending: Gynecology | Primary: Internal Medicine

## 2020-07-27 ENCOUNTER — Encounter: Admit: 2020-07-27 | Payer: PRIVATE HEALTH INSURANCE | Attending: Gynecology | Primary: Internal Medicine

## 2020-07-27 DIAGNOSIS — K219 Gastro-esophageal reflux disease without esophagitis: Secondary | ICD-10-CM

## 2020-07-27 DIAGNOSIS — Z01419 Encounter for gynecological examination (general) (routine) without abnormal findings: Secondary | ICD-10-CM

## 2020-07-27 DIAGNOSIS — N898 Other specified noninflammatory disorders of vagina: Secondary | ICD-10-CM

## 2020-07-27 MED ORDER — TERCONAZOLE 0.4 % VAGINAL CREAM
0.4 % | Freq: Every evening | VAGINAL | 2 refills | Status: AC
Start: 2020-07-27 — End: ?

## 2020-07-27 NOTE — Patient Instructions
DiverticulitisDiverticulitis is infection or inflammation of small pouches (diverticula) in the colon that form due to a condition called diverticulosis. Diverticula can trap stool (feces) and bacteria, causing infection and inflammation.Diverticulitis may cause severe stomach pain and diarrhea. It may lead to tissue damage in the colon that causes bleeding or blockage. The diverticula may also burst (rupture) and cause infected stool to enter other areas of the abdomen.What are the causes?This condition is caused by stool becoming trapped in the diverticula, which allows bacteria to grow in the diverticula. This leads to inflammation and infection.What increases the risk?You are more likely to develop this condition if you have diverticulosis. The risk increases if you:?	Are overweight or obese.?	Do not get enough exercise.?	Drink alcohol.?	Use tobacco products.?	Eat a diet that has a lot of red meat such as beef, pork, or lamb.?	Eat a diet that does not include enough fiber. High-fiber foods include fruits, vegetables, beans, nuts, and whole grains.?	Are over 87 years of age.What are the signs or symptoms?Symptoms of this condition may include:?	Pain and tenderness in the abdomen. The pain is normally located on the left side of the abdomen, but it may occur in other areas.?	Fever and chills.?	Nausea.?	Vomiting.?	Cramping.?	Bloating.?	Changes in bowel routines.?	Blood in your stool.How is this diagnosed?This condition is diagnosed based on:?	Your medical history.?	A physical exam.?	Tests to make sure there is nothing else causing your condition. These tests may include:?	Blood tests.?	Urine tests.?	[place] scan of the abdomen.How is this treated?Most cases of this condition are mild and can be treated at home. Treatment may include:?	Taking over-the-counter pain medicines.?	Following a clear liquid diet.?	Taking antibiotic medicines by mouth.?	Resting.More severe cases may need to be treated at a hospital. Treatment may include:?	Not eating or drinking.?	Taking prescription pain medicine.?	Receiving antibiotic medicines through an IV.?	Receiving fluids and nutrition through an IV.?	Surgery.When your condition is under control, your health care provider may recommend that you have a colonoscopy. This is an exam to look at the entire large intestine. During the exam, a lubricated, bendable tube is inserted into the anus and then passed into the rectum, colon, and other parts of the large intestine. A colonoscopy can show how severe your diverticula are and whether something else may be causing your symptoms.Follow these instructions at home:Medicines?	Take over-the-counter and prescription medicines only as told by your health care provider. These include fiber supplements, probiotics, and stool softeners.?	If you were prescribed an antibiotic medicine, take it as told by your health care provider. Do not stop taking the antibiotic even if you start to feel better.?	Ask your health care provider if the medicine prescribed to you requires you to avoid driving or using machinery.Eating and drinking?	Follow a full liquid diet or another diet as directed by your health care provider.?	After your symptoms improve, your health care provider may tell you to change your diet. He or she may recommend that you eat a diet that contains at least 25 grams (25 g) of fiber daily. Fiber makes it easier to pass stool. Healthy sources of fiber include:?	Berries. One cup contains 4-8 grams of fiber.?	Beans or lentils. One-half cup contains 5-8 grams of fiber.?	Green vegetables. One cup contains 4 grams of fiber.?	Avoid eating red meat.  General instructions?	Do not use any products that contain nicotine or tobacco, such as cigarettes, e-cigarettes, and chewing tobacco. If you need help quitting, ask your health care provider.?	Exercise for at least 30 minutes, 3 times each week. You should exercise hard enough to raise your heart rate and break a sweat.?	Keep all follow-up visits as told by your health care provider. This is important. You may need to have a colonoscopy.Contact a  health care provider if:?	Your pain does not improve.?	Your bowel movements do not return to normal.Get help right away if:?	Your pain gets worse.?	Your symptoms do not get better with treatment.?	Your symptoms suddenly get worse.?	You have a fever.?	You vomit more than one time.?	You have stools that are bloody, black, or tarry.Summary?	Diverticulitis is infection or inflammation of small pouches (diverticula) in the colon that form due to a condition called diverticulosis. Diverticula can trap stool (feces) and bacteria, causing infection and inflammation.?	You are at higher risk for this condition if you have diverticulosis and you eat a diet that does not include enough fiber.?	Most cases of this condition are mild and can be treated at home. More severe cases may need to be treated at a hospital.?	When your condition is under control, your health care provider may recommend that you have an exam called a colonoscopy. This exam can show how severe your diverticula are and whether something else may be causing your symptoms.Irritable Bowel Syndrome, AdultIrritable bowel syndrome (IBS) is a group of symptoms that affects the organs responsible for digestion (gastrointestinal or GI tract). IBS is not one specific disease.To regulate how the GI tract works, the body sends signals back and forth between the intestines and the brain. If you have IBS, there may be a problem with these signals. As a result, the GI tract does not function normally. The intestines may become more sensitive and overreact to certain things. This may be especially true when you eat certain foods or when you are under stress.There are four types of IBS. These may be determined based on the consistency of your stool (feces):IBS with diarrhea.IBS with constipation.Mixed IBS.Unsubtyped IBS.It is important to know which type of IBS you have. Certain treatments are more likely to be helpful for certain types of IBS.What are the causes?The exact cause of IBS is not known.What increases the risk?You may have a higher risk for IBS if you:Are female.Are younger than 67.Have a family history of IBS.Have a mental health condition, such as depression, anxiety, or post-traumatic stress disorder.Have had a bacterial infection of your GI tract.What are the signs or symptoms?Symptoms of IBS vary from person to person. The main symptom is abdominal pain or discomfort. Other symptoms usually include one or more of the following:Diarrhea, constipation, or both.Abdominal swelling or bloating.Feeling full after eating a small or regular-sized meal.Frequent gas.Mucus in the stool.A feeling of having more stool left after a bowel movement.Symptoms tend to come and go. They may be triggered by stress, mental health conditions, or certain foods.How is this diagnosed?This condition may be diagnosed based on a physical exam, your medical history, and your symptoms. You may have tests, such UJ:WJXBJ tests.Stool test.X-rays.[place] scan.Colonoscopy. This is a procedure in which your GI tract is viewed with a long, thin, flexible tube.How is this treated?There is no cure for IBS, but treatment can help relieve symptoms. Treatment depends on the type of IBS you have, and may include:Changes to your diet, such YN:WGNFAOZH foods that cause symptoms.Drinking more water.Following a low-FODMAP (fermentable oligosaccharides, disaccharides, monosaccharides, and polyols) diet for up to 6 weeks, or as told by your health care provider. FODMAPs are sugars that are hard for some people to digest.Eating more fiber.Eating medium-sized meals at the same times every day.Medicines. These may include:Fiber supplements, if you have constipation.Medicine to control diarrhea (antidiarrheal medicines).Medicine to help control muscle tightening (spasms) in your GI tract (antispasmodic medicines).Medicines to help with mental health conditions, such as antidepressants or tranquilizers.Talk therapy or counseling.Working with a diet and nutrition specialist (dietitian) to  help create a food plan that is right for you.Managing your stress.Follow these instructions at home:Eating and drinkingEat a healthy diet.Eat medium-sized meals at about the same time every day. Do not eat large meals.Gradually eat more fiber-rich foods. These include whole grains, fruits, and vegetables. This may be especially helpful if you have IBS with constipation.Eat a diet low in FODMAPs.Drink enough fluid to keep your urine pale yellow.Keep a journal of foods that seem to trigger symptoms.Avoid foods and drinks that:Contain added sugar.Make your symptoms worse. Dairy products, caffeinated drinks, and carbonated drinks can make symptoms worse for some people.General instructionsTake over-the-counter and prescription medicines and supplements only as told by your health care provider.Get enough exercise. Do at least 150 minutes of moderate-intensity exercise each week.Manage your stress. Getting enough sleep and exercise can help you manage stress.Keep all follow-up visits as told by your health care provider and therapist. This is important.Alcohol UseDo not drink alcohol BJ:YNWG health care provider tells you not to drink.You are pregnant, may be pregnant, or are planning to become pregnant.If you drink alcohol, limit how much you have:0-1 drink a day for women.0-2 drinks a day for men.Be aware of how much alcohol is in your drink. In the [place] one drink equals one typical bottle of beer (12 oz), one-half glass of wine (5 oz), or one shot of hard liquor (1? oz).Contact a health care provider if you have:Constant pain.Weight loss.Difficulty or pain when swallowing.Diarrhea that gets worse.Get help right away if you have:Severe abdominal pain.Fever.Diarrhea with symptoms of dehydration, such as dizziness or dry mouth.Bright red blood in your stool.Stool that is black and tarry.Abdominal swelling.Vomiting that does not stop.Blood in your vomit.SummaryIrritable bowel syndrome (IBS) is not one specific disease. It is a group of symptoms that affects digestion.Your intestines may become more sensitive and overreact to certain things. This may be especially true when you eat certain foods or when you are under stress.There is no cure for IBS, but treatment can help relieve symptoms.This information is not intended to replace advice given to you by your health care provider. Make sure you discuss any questions you have with your health care provider.Document Revised: 08/06/2017 Document Reviewed: 12/11/2018Elsevier Patient Education ? 2021 [employer].?	Keep all follow-up visits as told by your health care provider. This is important.This information is not intended to replace advice given to you by your health care provider. Make sure you discuss any questions you have with your health care provider.Document Revised: 05/25/2019 Document Reviewed: 09/28/2020Elsevier Patient Education ? 2021 [employer]DiverticulosisDiverticulosis is a condition that develops when small pouches (diverticula) form in the wall of the large intestine (colon). The colon is where water is absorbed and stool (feces) is formed. The pouches form when the inside layer of the colon pushes through weak spots in the outer layers of the colon. You may have a few pouches or many of them.The pouches usually do not cause problems unless they become inflamed or infected. When this happens, the condition is called diverticulitis.What are the causes?The cause of this condition is not known.What increases the risk?The following factors may make you more likely to develop this condition:?	Being older than age 19. Your risk for this condition increases with age. Diverticulosis is rare among people younger than age 51. By age 56, many people have it.?	Eating a low-fiber diet.?	Having frequent constipation.?	Being overweight.?	Not getting enough exercise.?	Smoking.?	Taking over-the-counter pain medicines, like aspirin and ibuprofen.?	Having a family history of diverticulosis.What are the signs or symptoms?In most people, there are no symptoms of this condition. If  you do have symptoms, they may include:?	Bloating.?	Cramps in the abdomen.?	Constipation or diarrhea.?	Pain in the lower left side of the abdomen.How is this diagnosed?Because diverticulosis usually has no symptoms, it is most often diagnosed during an exam for other colon problems. The condition may be diagnosed by:?	Using a flexible scope to examine the colon (colonoscopy).?	Taking an X-ray of the colon after dye has been put into the colon (barium enema).?	Having a [place] scan.How is this treated?You may not need treatment for this condition. Your health care provider may recommend treatment to prevent problems. You may need treatment if you have symptoms or if you previously had diverticulitis. Treatment may include:?	Eating a high-fiber diet.?	Taking a fiber supplement.?	Taking a live bacteria supplement (probiotic).?	Taking medicine to relax your colon.  Follow these instructions at home:Medicines?	Take over-the-counter and prescription medicines only as told by your health care provider.?	If told by your health care provider, take a fiber supplement or probiotic.Constipation preventionYour condition may cause constipation. To prevent or treat constipation, you may need to:?	Drink enough fluid to keep your urine pale yellow.?	Take over-the-counter or prescription medicines.?	Eat foods that are high in fiber, such as beans, whole grains, and fresh fruits and vegetables.?	Limit foods that are high in fat and processed sugars, such as fried or sweet foods.  General instructions?	Try not to strain when you have a bowel movement.?	Keep all follow-up visits as told by your health care provider. This is important.Contact a health care provider if you:?	Have pain in your abdomen.?	Have bloating.?	Have cramps.?	Have not had a bowel movement in 3 days.Get help right away if:?	Your pain gets worse.?	Your bloating becomes very bad.?	You have a fever or chills, and your symptoms suddenly get worse.?	You vomit.?	You have bowel movements that are bloody or black.?	You have bleeding from your rectum.Summary?	Diverticulosis is a condition that develops when small pouches (diverticula) form in the wall of the large intestine (colon).?	You may have a few pouches or many of them.?	This condition is most often diagnosed during an exam for other colon problems.?	Treatment may include increasing the fiber in your diet, taking supplements, or taking medicines.This information is not intended to replace advice given to you by your health care provider. Make sure you discuss any questions you have with your health care provider.Document Revised: 03/12/2019 Document Reviewed: 07/16/2020Elsevier Patient Education ? 2021 [employer]

## 2020-07-27 NOTE — Progress Notes
Michelle Barron is a 54 y.o. No obstetric history on file. female who presents to this practice for an annual exam.No LMP recorded. Patient is postmenopausal.Patient History?	Problem List: has Microhematuria and Depression on their problem list. ?	Past Surgical History: has a past surgical history that includes Appendectomy (20 yrs).?	Past Medical History: has a past medical history of GERD (gastroesophageal reflux disease).?	Family History: family history is not on file.?	Allergies:has No Known Allergies. Medications: Current Outpatient Medications: ?  amoxicillin, 500 mg, Oral, TID?  oxyCODONE-acetaminophen, 1 tablet, Oral, Q6H PRN?	?  tenofovir disoproxil, TAKE 1 TABLET BY MOUTH EVERY DAY ?	Social History: reports that she has never smoked. She has never used smokeless tobacco. She reports that she does not drink alcohol. ?	Intimate Partner Violence  No?	Menstrual/Sexual History: ?	Urology seen work up negative hematuria ?	Ob Hx G3P2 NSVD ?	Sexually active No?	Contraception N/A?	Dyspareunia Hx Deep inside ?	Cycles N/A?	Voids < 10?	Nocturia  5 x drinks ?	Menopausal Symptoms Hot Flashes ?	USI No ?	No Vulva issues ?	Vaginal itching at times ?	BM every other day pellet like stools?	ROS OB History No obstetric history on file. Objective: ?	BP 100/70  - Ht 5' 1 (1.549 m)  - Wt 80.9 kg  - Breastfeeding No  - BMI 33.71 kg/m?  Physical Exam?	General:  Appears well, no distress	?	Lungs: ?	Thyroid:normal to inspection and palpation?	Heart: ?	Breast Exam:Normal appearance, no masses or tenderness?	Abdomen: tender over Sigmoid colon 	?	External Genitalia: General appearance; normal?		?	Uterus Uterine size is 8 weeks and Uterus is  midposition?	        Vagina: Normal appearanceEstrogenized Kegel squeeze: 3/4	?	Cervix Normal			  ?	Adnexa: no palpable mass and no tenderness      Rectal: Health Maintenance: Colonoscopy:  Up to dateDexa Bone Density:  N/AMammogram:  Up to dateBreast [place]: N/APAP:  Completed todayAssessment / Plan: Michelle Barron is a 54 y.o. No obstetric history on file. femaleImp 1) LLG pain IBS/Divrerticulosis         2) s/p Right Eye Excision cyst 2013         3) Vaginal itchingPlan 1) f/u PCP /GI          2) Pap          3) Terazol 7 Patient Education:?	Specific topics reviewed: ?

## 2020-10-09 ENCOUNTER — Inpatient Hospital Stay: Admit: 2020-10-09 | Discharge: 2020-10-09 | Payer: MEDICAID

## 2020-10-09 ENCOUNTER — Emergency Department: Admit: 2020-10-09 | Payer: PRIVATE HEALTH INSURANCE | Primary: Internal Medicine

## 2020-10-09 ENCOUNTER — Emergency Department: Admit: 2020-10-09 | Payer: PRIVATE HEALTH INSURANCE | Attending: Diagnostic Radiology | Primary: Internal Medicine

## 2020-10-09 DIAGNOSIS — R0789 Other chest pain: Secondary | ICD-10-CM

## 2020-10-09 DIAGNOSIS — R079 Chest pain, unspecified: Secondary | ICD-10-CM

## 2020-10-09 DIAGNOSIS — R1011 Right upper quadrant pain: Secondary | ICD-10-CM

## 2020-10-09 DIAGNOSIS — R059 Cough, unspecified: Secondary | ICD-10-CM

## 2020-10-09 DIAGNOSIS — R112 Nausea with vomiting, unspecified: Secondary | ICD-10-CM

## 2020-10-09 LAB — CBC WITH AUTO DIFFERENTIAL
BKR WAM ABSOLUTE IMMATURE GRANULOCYTES.: 0.02 x 1000/ÂµL (ref 0.00–0.30)
BKR WAM ABSOLUTE LYMPHOCYTE COUNT.: 2 x 1000/ÂµL (ref 0.60–3.70)
BKR WAM ABSOLUTE NRBC (2 DEC): 0 x 1000/ÂµL (ref 0.00–1.00)
BKR WAM ANALYZER ANC: 5.86 x 1000/ÂµL (ref 2.00–7.60)
BKR WAM BASOPHIL ABSOLUTE COUNT.: 0.03 x 1000/ÂµL (ref 0.00–1.00)
BKR WAM BASOPHILS: 0.3 % (ref 0.0–1.4)
BKR WAM EOSINOPHIL ABSOLUTE COUNT.: 0.06 x 1000/ÂµL (ref 0.00–1.00)
BKR WAM EOSINOPHILS: 0.7 % (ref 0.0–5.0)
BKR WAM HEMATOCRIT (2 DEC): 40.9 % (ref 35.00–45.00)
BKR WAM HEMOGLOBIN: 12.7 g/dL (ref 11.7–15.5)
BKR WAM IMMATURE GRANULOCYTES: 0.2 % (ref 0.0–1.0)
BKR WAM LYMPHOCYTES: 23.2 % (ref 17.0–50.0)
BKR WAM MCH (PG): 27.9 pg (ref 27.0–33.0)
BKR WAM MCHC: 31.1 g/dL (ref 31.0–36.0)
BKR WAM MCV: 89.9 fL (ref 80.0–100.0)
BKR WAM MONOCYTE ABSOLUTE COUNT.: 0.65 x 1000/ÂµL (ref 0.00–1.00)
BKR WAM MONOCYTES: 7.5 % (ref 4.0–12.0)
BKR WAM MPV: 9.9 fL (ref 8.0–12.0)
BKR WAM NEUTROPHILS: 68.1 % (ref 39.0–72.0)
BKR WAM NUCLEATED RED BLOOD CELLS: 0 % (ref 0.0–1.0)
BKR WAM PLATELETS: 338 x1000/ÂµL (ref 150–420)
BKR WAM RDW-CV: 13.5 % (ref 11.0–15.0)
BKR WAM RED BLOOD CELL COUNT.: 4.55 M/ÂµL (ref 4.00–6.00)
BKR WAM WHITE BLOOD CELL COUNT: 8.6 x1000/ÂµL (ref 4.0–11.0)

## 2020-10-09 LAB — COMPREHENSIVE METABOLIC PANEL
BKR A/G RATIO: 1.7 (ref 1.0–2.2)
BKR ALANINE AMINOTRANSFERASE (ALT): 27 U/L (ref 10–35)
BKR ALBUMIN: 4.7 g/dL (ref 3.6–4.9)
BKR ALKALINE PHOSPHATASE: 71 U/L (ref 9–122)
BKR ANION GAP: 13 (ref 7–17)
BKR ASPARTATE AMINOTRANSFERASE (AST): 26 U/L (ref 10–35)
BKR AST/ALT RATIO: 1
BKR BILIRUBIN TOTAL: 0.4 mg/dL (ref <=1.2)
BKR BLOOD UREA NITROGEN: 10 mg/dL (ref 6–20)
BKR BUN / CREAT RATIO: 9.6 (ref 8.0–23.0)
BKR CALCIUM: 9.3 mg/dL (ref 8.8–10.2)
BKR CHLORIDE: 107 mmol/L (ref 98–107)
BKR CO2: 24 mmol/L (ref 20–30)
BKR CREATININE: 1.04 mg/dL (ref 0.40–1.30)
BKR EGFR (AFR AMER): >60 mL/min/{1.73_m2} (ref >60)
BKR EGFR (NON AFRICAN AMERICAN): 56 mL/min/{1.73_m2} (ref >60)
BKR GLOBULIN: 2.7 g/dL (ref 2.3–3.5)
BKR GLUCOSE: 110 mg/dL — ABNORMAL HIGH (ref 70–100)
BKR POTASSIUM: 4.1 mmol/L (ref 3.3–5.3)
BKR PROTEIN TOTAL: 7.4 g/dL (ref 6.6–8.7)
BKR SODIUM: 144 mmol/L (ref 136–144)

## 2020-10-09 LAB — URINALYSIS-MACROSCOPIC W/REFLEX MICROSCOPIC
BKR BILIRUBIN, UA: NEGATIVE
BKR GLUCOSE, UA: NEGATIVE
BKR KETONES, UA: NEGATIVE
BKR LEUKOCYTE ESTERASE, UA: NEGATIVE
BKR NITRITE, UA: NEGATIVE
BKR PH, UA: 7 (ref 5.5–7.5)
BKR PROTEIN, UA: NEGATIVE
BKR SPECIFIC GRAVITY, UA: 1.012 (ref 1.005–1.030)
BKR UROBILINOGEN, UA: <2 EU/dL (ref <=2.0)

## 2020-10-09 LAB — URINE MICROSCOPIC     (BH GH LMW YH)
BKR HYALINE CASTS, UA INSTRUMENT (NUMERIC): 1 /LPF (ref 0–3)
BKR RBC/HPF INSTRUMENT: 9 /HPF — ABNORMAL HIGH (ref 0–2)
BKR WBC/HPF INSTRUMENT: 1 /HPF (ref 0–5)

## 2020-10-09 LAB — TROPONIN T HIGH SENSITIVITY, 0 HOUR BASELINE WITH REFLEX (BH GH LMW YH): BKR TROPONIN T HS 0 HOUR BASELINE: <6 ng/L

## 2020-10-09 LAB — LIPASE: BKR LIPASE: 31 U/L (ref 11–55)

## 2020-10-09 MED ORDER — ALUMINUM-MAG HYDROXIDE-SIMETHICONE 200 MG-200 MG-20 MG/5 ML ORAL SUSP
200-200-20 mg/5 mL | Freq: Before meals | ORAL | Status: DC
Start: 2020-10-09 — End: 2020-10-10
  Administered 2020-10-09: 16:00:00 200-200-20 mL via ORAL

## 2020-10-09 MED ORDER — SODIUM CHLORIDE 0.9 % LARGE VOLUME SYRINGE FOR AUTOINJECTOR
Freq: Once | INTRAVENOUS | Status: CP | PRN
Start: 2020-10-09 — End: ?
  Administered 2020-10-09: 20:00:00 via INTRAVENOUS

## 2020-10-09 MED ORDER — POLYETHYLENE GLYCOL 3350 17 GRAM ORAL POWDER PACKET
17 gram | Freq: Every day | ORAL | 1 refills | Status: AC
Start: 2020-10-09 — End: ?

## 2020-10-09 MED ORDER — KETOROLAC 30 MG/ML (1 ML) INJECTION SOLUTION
30 mg/mL (1 mL) | Freq: Once | INTRAVENOUS | Status: CP
Start: 2020-10-09 — End: ?
  Administered 2020-10-09: 16:00:00 30 mL via INTRAVENOUS

## 2020-10-09 MED ORDER — IOHEXOL 350 MG IODINE/ML INTRAVENOUS SOLUTION
350 mg iodine/mL | Freq: Once | INTRAVENOUS | Status: CP | PRN
Start: 2020-10-09 — End: ?
  Administered 2020-10-09: 20:00:00 350 mL via INTRAVENOUS

## 2020-10-09 NOTE — ED Notes
9:26 AM 55 yo F presents to AED c/o right abd pain x 2 weeks. Pt reports being constipated which she thinks is causing abd pain. Has been taking miralax. Denies CP, SOB. Denies dysuria. Occassionally nauseas, denies vomiting. Denies f/c. A/Ox4. VSS on RA. Awaiting provider eval.Chief Complaint Patient presents with ? Abdominal Pain Past Medical History: Diagnosis Date ? GERD (gastroesophageal reflux disease)  10:18 AM EKG done. Labs collected. Med student performing bedside U/S. Awaiting urine sample.11:21 AM Meds given per MAR. 4:00 PM [place] completed. Awaiting dispo

## 2020-10-09 NOTE — ED Notes
9:19 AM Triage note: abdominal pain more on rt side x 2 weeks reports nausea denies vomiting/diarrhea, reports  feels constipated,last bm several days agoPt arrived to the ED with c/o ADB pain. Pt states she has had this intermittent adb for a long time.  Pt states that this current iteration began two weeks ago. Pt states some SOB/minor nausea; minor chills (last chronic always feels chills).  Pt no hx of respiratory ailments. Pt states has fatty liver. Pt states has Hep B.  Pt alert and oriented x 4. Pt able to talk in complete sentences without difficulty. Pt denies CP, V/D. Pt denies dizziness.  Pt denies urinary symptoms. 2:17 PMPt off to [place] scan.

## 2020-10-09 NOTE — ED Notes
10:40 AM Pt to imaging.

## 2020-10-11 NOTE — Telephone Encounter
ED Blue Follow Up message noted.  Spoke with Byrd Hesselbach and she was informed of the breast and liver findings and recommendations for f/u  She does have a provider at [address] [facility] and will call to secure a f/u appt. She was very appreciative of the f/u call.

## 2020-10-13 NOTE — ED Provider Notes
HistoryChief Complaint Patient presents with ? Abdominal Pain ? Chest Pain ? Cough  The history is provided by the patient. Abdominal PainPain location:  RUQ and R flankPain quality: dull  Pain radiates to:  Epigastric regionPain severity:  ModerateOnset quality:  GradualTiming:  IntermittentProgression:  Waxing and waningChronicity:  NewContext: not alcohol use  Relieved by:  NothingWorsened by:  NothingIneffective treatments:  None triedAssociated symptoms: chest pain and cough  Associated symptoms: no anorexia   Past Medical History: Diagnosis Date ? GERD (gastroesophageal reflux disease)  Past Surgical History: Procedure Laterality Date ? APPENDECTOMY  20 yrs No family history on file.Social History Socioeconomic History ? Marital status: Divorced   Spouse name: Not on file ? Number of children: Not on file ? Years of education: Not on file ? Highest education level: Not on file Tobacco Use ? Smoking status: Never Smoker ? Smokeless tobacco: Never Used Vaping Use ? Vaping Use: Never used Substance and Sexual Activity ? Alcohol use: No ED Other Social History ? E-cigarette status Never User  ? E-Cigarette Use Never User  E-cigarette/Vaping Substances ? Nicotine No  ? THC No  ? CBD No  ? Flavoring No  E-cigarette/Vaping Devices ? Disposable No  ? Pre-filled or Refillable Cartridge No  ? Refillable Tank No  ? Pre-filled Pod No  Review of Systems Respiratory: Positive for cough.  Cardiovascular: Positive for chest pain. Gastrointestinal: Positive for abdominal pain. Negative for anorexia. All other systems reviewed and are negative. Physical ExamED Triage Vitals [10/09/20 0909]BP: 129/85Pulse: (!) 92Pulse from  O2 sat: n/aResp: 18Temp: 97.1 ?F (36.2 ?C)Temp src: TemporalSpO2: 100 % BP 132/72  - Pulse 73  - Temp 98.2 ?F (36.8 ?C) (Oral)  - Resp 18  - SpO2 100% Physical ExamConstitutional:     Appearance: She is well-developed. HENT:    Head: Normocephalic and atraumatic.    Nose: Nose normal.    Mouth/Throat:    Mouth: Mucous membranes are moist. Eyes:    Pupils: Pupils are equal, round, and reactive to light. Cardiovascular:    Rate and Rhythm: Normal rate and regular rhythm. Pulmonary:    Effort: Pulmonary effort is normal. No respiratory distress.    Breath sounds: Normal breath sounds. Abdominal:    General: Bowel sounds are normal.    Palpations: Abdomen is soft.    Tenderness: There is abdominal tenderness in the right upper quadrant. There is no guarding or rebound. Musculoskeletal:       General: No tenderness. Normal range of motion.    Cervical back: Normal range of motion and neck supple.    Right lower leg: No edema.    Left lower leg: No edema. Skin:   General: Skin is warm and dry.    Findings: No rash. Neurological:    General: No focal deficit present.    Mental Status: She is alert. Psychiatric:       Mood and Affect: Mood normal.  ProceduresProcedures ED CourseClinical Impressions as of Oct 13 717 Abdominal pain, unspecified abdominal location  ED DispositionDischargeThis 55 year old female presents with chief complaint of right-sided abdominal discomfort which has been present for several weeks but worsening some.  She describes it as being in the right upper quadrant with some radiation to the flank.  She has some nausea without vomiting and does endorse some constipation.  She has also had some cough and states she has chest discomfort with the cough.  We will check labs EKG and a bedside ultrasound and re-evaluate.US unremarkable, [place] also negative. Will  d/c with medication for constipation and return precautions. Viona Gilmore, MD02/13/22 1028 Viona Gilmore, MD02/13/22 1656 Viona Gilmore, MD02/17/22 [id number]

## 2020-10-18 ENCOUNTER — Telehealth: Admit: 2020-10-18 | Payer: PRIVATE HEALTH INSURANCE | Primary: Internal Medicine

## 2020-10-18 NOTE — Telephone Encounter
She was seen in the ed They found a lump on left breastThere are no 40 min visits till April first weekShe would like to be seen sooner

## 2020-10-18 NOTE — Telephone Encounter
Pt given an appt with Dr Wardell Heath tomorrow 2/23

## 2020-10-19 ENCOUNTER — Ambulatory Visit: Admit: 2020-10-19 | Payer: MEDICAID | Attending: Gynecology | Primary: Internal Medicine

## 2020-10-19 DIAGNOSIS — R921 Mammographic calcification found on diagnostic imaging of breast: Secondary | ICD-10-CM

## 2020-10-19 NOTE — Progress Notes
GYN PROBLEM VISITHPI:Michelle Barron is a 55 y.o. G3P2 female who presents complaining of breast lumpNormal Mammogram 3/8/2021No LMP recorded (exact date). Patient is postmenopausal.Review of Symptoms:  Patient History?	Problem List: has Microhematuria and Depression on their problem list. ?	Past Surgical History: has a past surgical history that includes Appendectomy (20 yrs).?	Past Medical History: has a past medical history of GERD (gastroesophageal reflux disease).?	Allergies:has No Known Allergies. Medications: Current Outpatient Medications: ?  amoxicillin, 500 mg, Oral, TID?  oxyCODONE-acetaminophen, 1 tablet, Oral, Q6H PRN?  polyethylene glycol, 17 g, Oral, Daily?	?  tenofovir disoproxil, TAKE 1 TABLET BY MOUTH EVERY DAY ?	Social History:  reports that she has never smoked. She has never used smokeless tobacco. She reports that she does not drink alcohol.OB History Gravida Para Term Preterm AB Living 3 2         SAB TAB Ectopic Molar Multiple Live Births           2  # Outcome Date GA Lbr Len/2nd Weight Sex Delivery Anes PTL Lv 3 Gravida          2 Para          1 Para          Exam:?	BP 130/82  - Ht 5' 1 (1.549 m)  - Wt 77.7 kg  - LMP  (Exact Date)  - BMI 32.35 kg/m?   Last 3 weights 10/19/2020 09/21/2020 07/27/2020 Weight (lb) 171 lb 3.2 oz 176 lb 178 lb 6.4 oz Weight (kg) 77.656 kg 79.833 kg 80.922 kg  ?	?	General:  Appears well, No distress	      Breast No Masses or color changes noted No Nodes 	      Assessment & PlanImp 1) Left Breast Calcifications on [place] AB/PelvisPlan 1) Dx Mammogram and Ultrasound Exam Chaperoned by Vickii Penna

## 2020-10-27 ENCOUNTER — Encounter: Admit: 2020-10-27 | Payer: PRIVATE HEALTH INSURANCE | Attending: Gastroenterology | Primary: Internal Medicine

## 2020-10-27 DIAGNOSIS — B181 Chronic viral hepatitis B without delta-agent: Secondary | ICD-10-CM

## 2020-10-27 DIAGNOSIS — R932 Abnormal findings on diagnostic imaging of liver and biliary tract: Secondary | ICD-10-CM

## 2020-11-08 ENCOUNTER — Inpatient Hospital Stay: Admit: 2020-11-08 | Discharge: 2020-11-08 | Payer: MEDICAID | Primary: Internal Medicine

## 2020-11-08 DIAGNOSIS — R921 Mammographic calcification found on diagnostic imaging of breast: Secondary | ICD-10-CM

## 2020-12-05 ENCOUNTER — Inpatient Hospital Stay: Admit: 2020-12-05 | Discharge: 2020-12-05 | Payer: MEDICAID | Primary: Internal Medicine

## 2020-12-05 DIAGNOSIS — B181 Chronic viral hepatitis B without delta-agent: Secondary | ICD-10-CM

## 2020-12-05 DIAGNOSIS — R932 Abnormal findings on diagnostic imaging of liver and biliary tract: Secondary | ICD-10-CM

## 2020-12-05 MED ORDER — GADOTERATE MEGLUMINE 0.5 MMOL/ML (376.9 MG/ML) INTRAVENOUS SOLUTION
0.5 mmol/mL (376.9 mg/mL) | Freq: Once | INTRAVENOUS | Status: CP | PRN
Start: 2020-12-05 — End: ?
  Administered 2020-12-05: 22:00:00 0.5 mL via INTRAVENOUS

## 2020-12-08 ENCOUNTER — Encounter: Admit: 2020-12-08 | Payer: PRIVATE HEALTH INSURANCE | Attending: Urology | Primary: Internal Medicine

## 2020-12-08 ENCOUNTER — Ambulatory Visit: Admit: 2020-12-08 | Payer: MEDICAID | Attending: Urology | Primary: Internal Medicine

## 2020-12-08 DIAGNOSIS — K219 Gastro-esophageal reflux disease without esophagitis: Secondary | ICD-10-CM

## 2020-12-08 DIAGNOSIS — R3129 Other microscopic hematuria: Secondary | ICD-10-CM

## 2020-12-08 NOTE — Progress Notes
Follow-up Note Michelle Barron is a 55 y.o. female is here for follow up with established diagnosis microhematuria ?Pt reports no gross hematuria or flank pain.  Previously followed by Urologist in [place].  Dr Moise Boring.  No records available.  Pt moved here in August to be closer to daughter and is feeling socially isolated.  She watches her 67 year old grandson while daughter works. Pt would prefer to live with her daughter. She lives alone. She feels safe at home just lonely  .  Pt is working through this and feels things are better compared to previous visits?Urine cytology: negativeCT nephrogram: no stones no hydro. Mild urinary bladder wall thickening is nonspecificCystoscopy with Dr Mariana Single : showed a few glomerulations. No evidence of tumor or stones. Normal expansion. Retroflex exam normalIntermittent R flank discomfort.  Recent imaging reviewed:10/09/20 [place]: focal hyperenhancement in R liver lobe peripherally measuring up to 1.2 cm /recommend MRIMRI abdomen consistent with hepatic steatosis and liver hemangioma??HPIMaria  reports that she has never smoked. She has never used smokeless tobacco. She reports that she does not drink alcohol. No history on file for drug use.x  Past Medical History: Diagnosis Date ? GERD (gastroesophageal reflux disease)  Past Surgical History: Procedure Laterality Date ? APPENDECTOMY  20 yrs No Known AllergiesReview Of SystemsReview of SystemsReview of SystemsGeneral: Patient denies fevers, chills, sweats, loss of appetite, fatigue, malaise. Eyes: Patient denies blurring, double vision, vision loss, eye pain, light sensitivity. Ears/Nose/Throat: Patient denies earache, ringing in ears, decreased hearing, nasal congestion, difficulty swallowing. Cardiovascular: Patient denies chest pains, palpitations, fainting spells, shortness of breath, ankle swelling.  Respiratory: Patient denies cough, wheezing.  Skin: Patient denies rash, itching, suspicious lesions. GI: Patient denies nausea, vomiting, diarrhea, constipation, change in bowel habits, abdominal pain, bloody or black stools.  GU: Patient denies pain with urination, blood in urine, frequent urination, loss of bladder control, difficulty starting urination, frequent urination at night.  Musculoskeletal: Patient denies back pain, joint pain, joint swelling, muscle cramps, muscle weakness, stiffness, arthritis. Neurological: Patient denies transient paralysis, weakness, numbness, tingling sensation, seizures, tremors, headaches.  Psychiatric: Patient denies depression, anxiety, memory loss, suicidal thoughts, hallucinations, paranoia. Endocrine: Patient denies cold intolerance, heat intolerance, increased thirst, increased appetite, large quantities of urine. Heme/Lymphatic: Patient denies abnormal bruising, bleeding, enlarged lymph nodes. Allergy/Immunologic: Patient denies persistent infections, HIV exposures. Physical ExamHt 5' 1 (1.549 m)  - Wt 74.8 kg  - BMI 31.18 kg/m? Lab Results Component Value Date  UCOLOR yellow 12/08/2020  UGLUCOSE Negative 12/08/2020  UKETONE Negative 12/08/2020  USPECGRAVITY 1.030 12/08/2020  UPROTEIN Trace 12/08/2020  UNITRATES Negative 12/08/2020  UBLOOD Positive 12/08/2020  ULEUKOCYTES Negative 12/08/2020    No results found for: PVRPOCTPhysical ExamPhysical Exam Constitutional: oriented to person, place, and time.  appears well-developed and well-nourished. HENT: Head: Normocephalic. Eyes: No scleral icterus. Cardiovascular: Normal rate.  Pulmonary/Chest: Effort normal. Abdominal: Soft. Musculoskeletal: Normal range of motion. Lymphadenopathy:    no cervical adenopathy. Neurological: alert and oriented to person, place, and time. Skin: Skin is warm and dry. Psychiatric: Has a normal mood and affect. His behavior is normal. Judgment and thought content normal. Assessment & Plan:[name] is a 55 y.o. female Encounter Diagnoses Name SNOMED [place](R) Primary? ? Microhematuria MICROSCOPIC HEMATURIA Yes Microhematuria with negative work up Urine cytologyRTO in 6 mos/sooner gross hematuria/interim difficultyCynthia L Karsynn Deweese, APRN

## 2020-12-15 NOTE — Other
Urine cytology is negative.

## 2021-01-24 ENCOUNTER — Encounter: Admit: 2021-01-24 | Payer: PRIVATE HEALTH INSURANCE | Attending: Gastroenterology | Primary: Internal Medicine

## 2021-01-24 DIAGNOSIS — R1031 Right lower quadrant pain: Secondary | ICD-10-CM

## 2021-02-07 ENCOUNTER — Ambulatory Visit: Admit: 2021-02-07 | Payer: MEDICAID | Attending: Otolaryngology | Primary: Internal Medicine

## 2021-02-07 ENCOUNTER — Encounter: Admit: 2021-02-07 | Payer: PRIVATE HEALTH INSURANCE | Attending: Otolaryngology | Primary: Internal Medicine

## 2021-02-07 DIAGNOSIS — K219 Gastro-esophageal reflux disease without esophagitis: Secondary | ICD-10-CM

## 2021-02-07 DIAGNOSIS — G4733 Obstructive sleep apnea (adult) (pediatric): Secondary | ICD-10-CM

## 2021-02-07 NOTE — H&P
 - [facility]. Michelle Barron is a 55 y.o. female presenting to the [facility] for obstructive sleep apnea. Sleep study performed March 7th 2022 shows an AHI of 23, RDI 32 and lowest O2 sat of 80%. She has tried to using CPAP in the past with multiple mask types, with and without humidity, without being able to tolerate it. She was seen by pulmonology on April 6th 2022 who discussed repeat CPAP trial vs dental appliance vs surgery with ENT and she opted to be evaluated for surgery. She has a BMI of 31 and is a non-smoker. Past Medical History: Diagnosis Date ? GERD (gastroesophageal reflux disease)  Past Surgical History: Procedure Laterality Date ? APPENDECTOMY  20 yrs Prior to Admission medications  Medication Sig Start Date End Date Taking? Authorizing Provider tenofovir disoproxil (VIREAD) 300 mg tablet TAKE 1 TABLET BY MOUTH EVERY DAY 09/05/19  Yes Provider, Historical amoxicillin (AMOXIL) 500 MG capsule Take 500 mg by mouth 3 (three) times daily.Patient not taking: Reported on 02/07/2021    Provider, Historical oxyCODONE-acetaminophen (PERCOCET) 10-650 mg per tablet Take 1 tablet by mouth every 6 (six) hours as needed.Patient not taking: Reported on 02/07/2021    Provider, Historical polyethylene glycol (MIRALAX) 17 gram packet Take 1 packet (17 g total) by mouth daily. Mix in 8 ounces of water, juice, soda, coffee or tea prior to taking.Patient not taking: Reported on 02/07/2021 10/09/20   Viona Gilmore, MD No Known AllergiesBP 123/87  - Pulse (!) 97  - Temp 99.4 ?F (37.4 ?C)  - SpO2 100% Physical Exam:Constitutional: Well nourished in no acute distress. Breathing comfortably on room air. Responds appropriately to questioning and exam commands. Voice: Clear and strongHead and Face: There were no concerning cutaneous lesions of the head or face. Facial movements (CN VII) fully intact and symmetric. CN V1-V3 intact bilaterally. Eyes: Extraocular movements (CNIII, IV, VI) were intact with no nystagmus. No scleral jaundice. No periorbital edema or ecchymosis. No lagophthalmos or enophthalmos. Ears: The auricles were normal bilaterally. The external auditory canals were clear and the tympanic membranes were flat without any evidence of retraction or effusion.  Nose: Normal cartilaginous and bony dorsum.  Oral Cavity/Oroharynx: Tonsil size 1+ and Malampati 4 --> Friedman 4. Full tongue mobility (CN XII), soft floor of mouth, no concerning lesions of the oral tongue, floor of mouth, hard palate, attached gingiva, RMTs or lips. Symmetric palate rise. Neck/Lymphatic: No palpable lymph nodes or masses in the lateral neck or central compartment. No palpable thyroid masses. No palpable parotid or submandibular salivary gland masses. A/P: This is Michelle Barron 55 y.o. female with OSA who has failed CPAP in the recent past. Unfortunately, here oropharynx anatomy is not compatible with UPPP, and under insurance precludes her for [facility] referral at this time. - Referral placed to dentistry for oral cavity appliance trial- Return to ENT clinic as needed  Electronically Signed by [place] M Azilee Pirro, MD, February 07, 2021

## 2021-02-14 ENCOUNTER — Inpatient Hospital Stay: Admit: 2021-02-14 | Discharge: 2021-02-14 | Payer: MEDICAID | Primary: Internal Medicine

## 2021-02-14 DIAGNOSIS — R1031 Right lower quadrant pain: Secondary | ICD-10-CM

## 2021-02-14 MED ORDER — SODIUM CHLORIDE 0.9 % LARGE VOLUME SYRINGE FOR AUTOINJECTOR
Freq: Once | INTRAVENOUS | Status: CP | PRN
Start: 2021-02-14 — End: ?
  Administered 2021-02-14: 22:00:00 via INTRAVENOUS

## 2021-02-14 MED ORDER — IOHEXOL 350 MG IODINE/ML INTRAVENOUS SOLUTION
350 mg iodine/mL | Freq: Once | INTRAVENOUS | Status: CP | PRN
Start: 2021-02-14 — End: ?
  Administered 2021-02-14: 22:00:00 350 mL via INTRAVENOUS

## 2021-03-08 ENCOUNTER — Inpatient Hospital Stay: Admit: 2021-03-08 | Discharge: 2021-03-08 | Payer: MEDICAID | Attending: Emergency Medicine

## 2021-03-08 ENCOUNTER — Encounter: Admit: 2021-03-08 | Payer: PRIVATE HEALTH INSURANCE | Primary: Internal Medicine

## 2021-03-08 DIAGNOSIS — K219 Gastro-esophageal reflux disease without esophagitis: Secondary | ICD-10-CM

## 2021-03-08 NOTE — ED Notes
5:45 PM PT c/o of spitting blood in sink at 3am and then in the morning stopped and then at 2pm it started again. Pt unsure of whether it comes from stomach mouth or gums. When asked about suicidal thoughts pt is unable to speak and begins to cry and look away but allows RN to hold her hand and squeezes back. When asked suicidal thought from [place] screening exam pt quickly utters to not wake up and begins to cry harder pt supported and empathy provided pt offered her thank you. PT states she feel safe at home.

## 2021-03-09 NOTE — Plan of Care
Plan of Care Overview/ Patient Status    Emergency Room Psychiatric Plan of Care Plan of Care Note Type:  Initial Plan of CareSafety Intervention(s):  Assumed care of patient, Patient identification verified, Patient notified of video monitoring, Patient oriented to unit including bathroom, shower, phone, visiting policy and patient rights and Routine safety checks per unit policySupervision Intervention(s):  Standard/Routine per unit policyLegal Status Intervention(s):  Not yet determinedMedication Intervention(s):  Medications administered as orderedVital Signs Intervention(s):  Vital signs obtained as orderedLab/Procedure Intervention(s):  Lab(s) obtained as orderedDiet Intervention(s):  Meals, snacks and beverages offerredPain Intervention(s):  Pain assessedADL's Intervention(s):  Bathroom facilities offeredSleep Intervention(s):  Sleep and relaxation offered and promotedSupport Intervention(s):  Staff supportGoal - Patient verbalizes an understanding for requiring a psychiatric evaluation in the Emergency Department:  ActiveGoal - Patient utilizes self-discipline and coping strategies to reduce anxiety:  ActiveGoal - Patient verbalizes to staff if he/she feels unsafe and/or plans to hurt self/others:  ActiveShift DocumentationInitial. See ED Note.Elpidio Galea, RN07/13/228:41 PM

## 2021-03-09 NOTE — ED Notes
Psychiatric Emergency Nursing COVID Screening1) Has the patient had a health care professional recommend they receive a coronavirus test or do they have a test currently pending? (If yes, please describe)No / UnsureNo results found for: SARSCOV22) In the last 14 days have you been in contact with someone who was confirmed or suspected to have coronavirus? (If yes, please describe)No / Siri Cole) In the last 14 days has the patient traveled to or spent time in any of the following locations or facilities?Out of state travel (If yes, where?) Nursing home Group home / Adult home Shelter / [facility] No / Patsey Berthold) RN communication to Provider: N/AElectronically Signed by Elpidio Galea, RN, March 08, 2021

## 2021-03-09 NOTE — ED Notes
8:36 PM Report received, pt escorted to CIU by protective services and unit staff, safety check completed, belongings logged and secured, care assumed. Pt presents as tearful, inquiring about discharge. Denies SI/HI/AH/VH at this time. Psychiatric eval in progress. Safety checks in place. 9:42 PM Pt psychiatrically cleared for discharge, escorted off the unit by unit staff with all belongings. Discharged in private vehicle to home.

## 2021-03-09 NOTE — Other
[facility] [facility] EMERGENCY DEPARTMENT	Emergency Department Psychiatric Evaluation7/13/2022Location of Evaluation (ex: [facility], [facility] Crisis):  Michelle Barron is a 55 y.o. Divorced female.PRESENTATION HISTORY Referred by:  ED PhysicianPatient seen in consultation at the request of:  ED Medicine Attending Dr. Aggie Hacker from:  [facility] Status on Arrival:  NoneSource of Information:  Patient, Family and Chart/Previous RecordChief Complaint:  ' I'm fine' HPIThis is a 55 year old female with a past psychiatric hx of depressive disorder and a medical hx of GERD who presented to the ED for oral bleeding screened positive on CSSR and referred to psychiatry for an evaluation. Chart review reveals one prior ED visit last year for similar circumstances. Pt is tearful on evaluation. Pt reports feeling stressed out having to see doctors everyday for multiple medical issues. Reports that she is isolated and has no one to discuss her difficulties with. She lives with her younger daughter but feels that she is getting in the way of her living her life. Works as [employer] and does not have time to do many things. She reports that her sleep is disrupted due to sleep apnea. Pt notes that she sometimes feels that she wants to go sleep and not wake up. Denies every thinking of a plan or actual intent.  She states that she has been having a difficult time ever since she moved to [place] from [place] 2 years ago. She has been thinking about going to [place] where she has other relatives and a friend. Reports that she was someone weekly in the past which was helpful. She also notes difficulties with her memory. States that she has at times gone to the wrong pts and once left the stove on at home for tea and totally forgot about it. Was also on meds but that never really helped her. She is interested in connecting back to treatment to help her feel better. Referred to IOP after her last visit but could not make it work with her schedule. Spoke to pts daughter [name] who notes that pt lives with her younger sister. She reports that in terms of her mental heatlh pt has been doing better than she did last years. Denies any safety related concerns. Thinks that pts doctors need to do better in terms of getting to the bottom of her medical problems. Problem(s): Isolation, depressed mood, poor social supports, medical issuesTiming:  intermittentSeverity:  moderateAssociated Signs and Symptoms:  Poor social support. Modifying Factors:  not connected to treatmentAccess to Firearms:  Denies  REVIEW OF SYSTEMS Review of Systems Constitutional: Negative for appetite change and unexpected weight change. HENT: Negative.  Respiratory: Negative.  Gastrointestinal: Negative.  Genitourinary: Negative.  Musculoskeletal: Negative.  Psychiatric/Behavioral: Positive for dysphoric mood and sleep disturbance. The patient is nervous/anxious.  OUTPATIENT MEDICATIONS Current Outpatient Medications Medication Sig ? amoxicillin Take 500 mg by mouth 3 (three) times daily. (Patient not taking: Reported on 02/07/2021) ? chlorhexidine gluconate Swish 1 capful in mouth for 1 minute and spit out once a day in the evening and after brushing ? oxyCODONE-acetaminophen Take 1 tablet by mouth every 6 (six) hours as needed. (Patient not taking: Reported on 02/07/2021) ? polyethylene glycol Take 1 packet (17 g total) by mouth daily. Mix in 8 ounces of water, juice, soda, coffee or tea prior to taking. (Patient not taking: Reported on 02/07/2021) ? tenofovir disoproxil TAKE 1 TABLET BY MOUTH EVERY DAY Medication Comments: not on on psychiatric meds currently. HEALTH ISSUES / MEDICAL HISTORY / ALLERGIES Past Medical  History: Diagnosis Date ? GERD (gastroesophageal reflux disease)  OB History Gravida Para Term Preterm AB Living 3 2 SAB IAB Ectopic Molar Multiple Live Births           2  # Outcome Date GA Lbr Len/2nd Weight Sex Delivery Anes PTL Lv 3 Gravida          2 Para          1 Para          Past Surgical History: Procedure Laterality Date ? APPENDECTOMY  20 yrs Allergies: Patient has no known allergies.PSYCHIATRIC  TREATMENT HISTORY Psychiatric Treatment History ? Inpatient Hospitalization No  Current Psychiatric Providers (on file): No data recordedSOCIAL HISTORY Social History Socioeconomic History ? Marital status: Divorced Tobacco Use ? Smoking status: Never Smoker ? Smokeless tobacco: Never Used Vaping Use ? Vaping Use: Never used Substance and Sexual Activity ? Alcohol use: No SUBSTANCE ABUSE HISTORY Alcohol (quantity, last use): deniesOther substances (quantity, last use): deniesFAMILY HISTORY History reviewed. No pertinent family history.LABORATORY/DIAGNOSTIC RESULTS No results found for this or any previous visit (from the past 24 hour(s)).Relevant laboratory/medical data (from the past 24 hours) includes:  NAPHYSICAL EXAM Vitals:  03/08/21 1901 BP: 129/87 Pulse: 71 Resp: 18 Temp: 98.5 ?F (36.9 ?C) Physical ExamI have reviewed the physical exam/work-up performed by Dr. Reuben Likes.The patient has been medically cleared by Dr. Reuben Likes.PSYCHIATRIC EXAMINATION General AppearanceHabitus was medium. Height was short. MusculoskeletalGait was normal. Psychiatric ExaminationAttitude: cooperative Psychomotor Behavior: normal Speech: Rate: was slow.Volume: was soft.Prosody: normal Mood: fine Affect: Affective Quality was dysphoric, was anxious, was tearful.Affective Range: full range Affective Intensity: normal Thought Process:  Coherent Associations: normal Thought Content: no delusions or hallucinations Suicidal Ideation: had thoughts of deathViolent Ideation: no current violent/homicidal ideation, intent or plan Insight: fair Judgment: fair Cognitive EvaluationSensorium: alert Orientation: oriented to person, place and date Attention and Concentration:  WORLD backwards: unable / no effortSAFETY AND RISK ASSESSMENT [place] - Suicide Severity Screen  Flowsheet Row Most Recent Value Have you wished you were dead or wished you could go to sleep and not wake up? yes Have you actually had any thoughts of killing yourself? no Have you ever done anything, started to do anything, or prepared to do anything to end your life? no [place] Suicide Risk Level low risk  PSY RISK ASSESSMENT SAFE-T WITH C-SSRS  Reason for Assessment:  Psychiatric Emergency Department EvaluationC-SSRS:   WISH TO BE DEAD:  Yes  SUICIDAL THOUGHTS:  No  SUICIDAL THOUGHTS WITH METHOD:  No  SUICIDAL INTENT WITHOUT SPECIFIC PLAN:  No  Have you ever done anything, started to do anything or prepared to do anything to end your life?:  NoSuicidal Ideation Intensity :   FREQUENCY:  2-5 times a week  DURATION:  Fleeting - few seconds or minutes  CONTROLLABILITY:  Easily able to control thoughts  DETERRENTS:  Deterrents definitely stopped you from attempting suicide  REASONS FOR IDEATION:  Does not applyRisk Assessment:   Access to Lethal Methods:  NoCurrent and Past Psychiatric Diagnoses:    Mood Disorder:  Recurrent/Current  Suicide Attempt:  No Prior Attempts  Presenting Symptoms:  Anxiety and/or Panic:  Yes  Insomnia:  Yes  Precipitants / Stressors:   Social Isolation:  Yes  Perceived burden on others:  Yes  Stressful Life Events:  Yes  Change in Treatment:    Not receiving treatment:  YesProtective Factors:   Internal:   Ability to cope with stress:  Yes  Frustration tolerance:  No  Religious beliefs:  No  Fear of death or the actual act of killing self:  Yes  Identifies reasons for living:  Yes  Problem solving skills:  Yes  External:   Cultural, spiritual and/or moral attitudes against suicide:  Yes  Responsibility to children:  Yes  Beloved pets:  No  Supportive social network of friends or family:  No  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 - Self-Injurious Behavior:   Current Urges to harm Self:  No  Imminent Risk for Self Injury in Community:  Low  Imminent Risk for Self Injury in Facility:  LowRisk to Others:   Current Agitation:  No  Imminent Risk for Violence in Community:  Low  Imminent Risk for Violence in Facility:  LowWithdrawal Risk:   Alcohol / Benzodiazepines / Barbiturates:  Not applicable     Alcohol/Benzodiazepine/Barbiturate Risk for Withdrawal:  Absent  Opioids:  Not applicable     Opioid Risk for Withdrawal:  AbsentIMPRESSION / ASSESSMENT AND PLAN This is a 55 year old divorced F with a hx of depressive disorder who presented to the ED for dental bleeding who screened positive for on the CSSR and referred for evaluation. Pt notes depressed mood in the setting of increased medical issues, social isolation. Pt notes that her life is not what she had imagined it to be and feels sad. She reports getting anxious as well. Has difficulty with sleep sometimes wishes that she would go to sleep and not wake up. Not connected with treatment. Collateral from daughter reassuring. Pt denies intent or plan. She is future oriented and treatment seeking.Problem List:Active Hospital Problems  Diagnosis ? Depression [F32.A] The patient was assessed for suicide risk specifically and found to be at LOW risk in the community for suicide.  The patient's risk in the facility is assessed to be  MINIMAL.  Concerning acute risk factors include depressed mood, social isolation, lack of connection to mental health treatment.  The patient has strong protective factors which include future orientation, treatment seeking, engaged in work, supportive family. Suicide Risk is based on the definitions described by the [facility] and [facility]'s screening tool, the Suicide Assessment Five-step Evaluation and Triage (SAFE-T) for Mental Health Professionals (see: SAFE_T.pdf).  Based on this assessment, the patient will be: discharged.Legal Status Post Evaluation:  No Legal Status (Observation, Re-Evaluation, Continue on PEER or Discharge)Additional Disposition Information/Plan:  - discharge- Information of treatment in the community given. - recommended to call 211/911 if feeling unsafe.  Loran Senters Cylinder, MD07/13/22 2143

## 2021-03-09 NOTE — Discharge Instructions
Please followup with [facility]istry [facility] as recommended. Please call [facility] to establish MH servicesI have also identified a portuguese speaking therapist that you may connect.

## 2021-03-09 NOTE — ED Notes
7:20 PM Verbal report received from Fleet Contras, [place], care assumed. Pt awaiting admitting bed in CIU. 8:29 PMPt to CIU, care deferred.

## 2021-03-17 NOTE — ED Provider Notes
--------------------------------------------------------------------------------------------------------------------------------Medical Decision Making:Pt is a 55 y.o. female w/ PMH of GERD who presents with bleeding from mouth.  Patient reports that she woke up this morning at 3:00 a.m. and noticed that she had blood in her mouth which she spent in nursing.  She reports that it was relatively small amount of blood and she did not have any difficulty breathing.  She reports that this began last night has not ever happened before.  Of note, patient was seen at an outpatient dentist for assessment of a bridge on her left upper mouth and found to possibly have infection for which she completed a course oral antibiotics.  She denies neck pain or fever.  She does endorse some occasional epigastric pain that comes and goes and is not associated with activity.  She has been maintaining p.o. intake.  No nausea or vomiting.  Review systems is also negative for chest pain cough diarrhea or dysuria.  Of note, patient does report to myself and nursing that she has thoughts of not wanting to wake up because she believes that no one wants to help her because she has been sent to multiple dentists with inability to manage her Bridge issue due to insurance complications.PE findings:BP 129/87  - Pulse 71  - Temp 98.5 ?F (36.9 ?C) (Oral)  - Resp 18  - SpO2 100% Patient is hypertensive, afebrile, heart rate is normal, does not appear to be in respiratory distress, alert and oriented, no gross neurological deficitsPlan: ED Course: Evaluated with Dr. Irven Baltimore, MD, MPHEmergency Medicine ResidentAvailable through MHB7/22/2022 5:55 PMThis note may have been generated using M*Modal voice dictation software please excuse any typographical errors due to flaws in voice recognition.This encounter occurred within the context of the Covid-19 pandemic. --------------------------------------------------------------------------------------------------------------------------------HistoryChief Complaint Patient presents with ? Medical Problem   Pt reports having intermittent episodes of blood in her mouth. Pt also reports abdominal discomfort. Pt currently has no blood in mouth. Airway intact. + nausea. ? Abdominal Pain  The history is provided by the patient and medical records. IllnessThis is a new problem. The current episode started 12 to 24 hours ago. The problem occurs constantly. The problem has not changed since onset.Pertinent negatives include no chest pain.  Past Medical History: Diagnosis Date ? GERD (gastroesophageal reflux disease)  Past Surgical History: Procedure Laterality Date ? APPENDECTOMY  20 yrs History reviewed. No pertinent family history.Social History Socioeconomic History ? Marital status: Divorced Tobacco Use ? Smoking status: Never Smoker ? Smokeless tobacco: Never Used Vaping Use ? Vaping Use: Never used Substance and Sexual Activity ? Alcohol use: No ED Other Social History ? E-cigarette status Never User  ? E-Cigarette Use Never User  E-cigarette/Vaping Substances ? Nicotine No  ? THC No  ? CBD No  ? Flavoring No  E-cigarette/Vaping Devices ? Disposable No  ? Pre-filled or Refillable Cartridge No  ? Refillable Tank No  ? Pre-filled Pod No  Review of Systems Cardiovascular: Negative for chest pain. All other systems reviewed and are negative. Physical ExamED Triage Vitals [03/08/21 1609]BP: (!) 145/84Pulse: (!) 98Pulse from  O2 sat: n/aResp: 18Temp: 98.3 ?F (36.8 ?C)Temp src: n/aSpO2: 100 % BP 129/87  - Pulse 71  - Temp 98.5 ?F (36.9 ?C) (Oral)  - Resp 18  - SpO2 100% Physical ExamVitals and nursing note reviewed. Constitutional:     Appearance: Normal appearance. HENT:    Head: Normocephalic. Eyes:    Extraocular Movements: Extraocular movements intact.    Pupils: Pupils are equal, round, and reactive to  light. Cardiovascular:    Rate and Rhythm: Normal rate.    Pulses: Normal pulses. Pulmonary:    Effort: Pulmonary effort is normal. Abdominal:    General: Abdomen is flat.    Palpations: Abdomen is soft. Skin:   General: Skin is warm and dry. Neurological:    General: No focal deficit present.    Mental Status: She is alert and oriented to person, place, and time. Psychiatric:       Mood and Affect: Mood normal.       Behavior: Behavior normal.  ProceduresProcedures ED COURSEPatient Reevaluation: Attending Supervised: ResidentI saw and examined the patient. I agree with the findings and plan of care as documented in the resident's note. Chastin Garlitz is a 55 y.o. female who presents to the Emergency Department complaining of increased feelings of depression and passive suicidal ideation in setting of frustration over dental issue.  She reports the lack of insurance impacting ability to get dental care.  Notes intermittent bleeding from site of prior dental work.No abdominal pain.No nausea, vomiting, diarrhea.Vital stable, SaO2 preserved.No active hemorrhage from oral cavity.  Site around right upper molar with evidence of recent bleed.No evidence of periapical abscess.Plan:Establish dental follow-up in our clinic.Psychiatry consult for suicidal ideation.  Nada Libman SatherClinical Impressions as of 03/17/21 0507 Depression, unspecified depression type  ED DispositionDischarge Inez Pilgrim, MD07/13/22 1836 Inez Pilgrim, MD07/22/22 [phone]

## 2021-06-12 ENCOUNTER — Ambulatory Visit: Admit: 2021-06-12 | Payer: PRIVATE HEALTH INSURANCE | Attending: Urology | Primary: Internal Medicine

## 2021-07-17 ENCOUNTER — Encounter: Admit: 2021-07-17 | Payer: PRIVATE HEALTH INSURANCE | Attending: Urology | Primary: Internal Medicine

## 2021-07-17 ENCOUNTER — Ambulatory Visit: Admit: 2021-07-17 | Payer: MEDICAID | Attending: Urology | Primary: Internal Medicine

## 2021-07-17 DIAGNOSIS — R109 Unspecified abdominal pain: Secondary | ICD-10-CM

## 2021-07-17 DIAGNOSIS — K219 Gastro-esophageal reflux disease without esophagitis: Secondary | ICD-10-CM

## 2021-07-17 NOTE — Progress Notes
Follow-up Note Michelle Barron is a 55 y.o. female is here for follow up with established diagnosis microhematuria ?Pt reports no gross hematuria or flank pain.  Previously followed by Urologist in [place].  Dr Moise Boring.  No records available. Pt is bothered by urinary hesitancy and intermittent low back pain worse on R.  No gross hematuria. No dysuria Reviewed previous work upUrine cytology: negativeCT nephrogram: no stones no hydro. Mild urinary bladder wall thickening is nonspecificCystoscopy with Dr Mariana Single : showed a few glomerulations. No evidence of tumor or stones. Normal expansion. Retroflex exam normalIntermittent R flank discomfort.  Recent imaging reviewed:10/09/20 [place]: focal hyperenhancement in R liver lobe peripherally measuring up to 1.2 cm /recommend MRIMRI abdomen consistent with hepatic steatosis and liver hemangioma?HPIMaria  reports that she has never smoked. She has never used smokeless tobacco. She reports that she does not drink alcohol. No history on file for drug use.  Past Medical History: Diagnosis Date ? GERD (gastroesophageal reflux disease)  Past Surgical History: Procedure Laterality Date ? APPENDECTOMY  20 yrs No Known AllergiesReview Of SystemsReview of SystemsGU: See HPIPhysical ExamTemp 98.1 ?F (36.7 ?C) (No-touch scanner)  - Ht 5' 1 (1.549 m)  - Wt 77.1 kg  - BMI 32.12 kg/m? Lab Results Component Value Date  UCOLOR yellow 12/08/2020  UGLUCOSE Negative 12/08/2020  UKETONE Negative 12/08/2020  USPECGRAVITY 1.030 12/08/2020  UPROTEIN Trace 12/08/2020  UNITRATES Negative 12/08/2020  UBLOOD Positive 12/08/2020  ULEUKOCYTES Negative 12/08/2020    No results found for: PVRPOCTPhysical ExamPhysical Exam Constitutional: oriented to person, place, and time.  appears well-developed and well-nourished. HENT: Head: Normocephalic. Eyes: No scleral icterus. Cardiovascular: Normal rate.  Pulmonary/Chest: Effort normal. Abdominal: Soft. Musculoskeletal: Normal range of motion. Lymphadenopathy:    no cervical adenopathy. Neurological: alert and oriented to person, place, and time. Skin: Skin is warm and dry. Psychiatric: Has a normal mood and affect. Her behavior is normal. Judgment and thought content normal. Assessment & Plan:Michelle Barron is a 55 y.o. female Encounter Diagnoses Name SNOMED Spencer(R) Primary? ? Right flank pain RIGHT FLANK PAIN Yes ? Microhematuria MICROSCOPIC HEMATURIA  Reviewed previous hematuria work up with patientPt continues with flank discomfortRenal usUrine cytologyRTO in 1 year /sooner interim difficultyCynthia L Judine Arciniega, APRN

## 2021-07-18 DIAGNOSIS — R3129 Other microscopic hematuria: Secondary | ICD-10-CM

## 2021-08-09 ENCOUNTER — Inpatient Hospital Stay: Admit: 2021-08-09 | Discharge: 2021-08-09 | Payer: MEDICAID | Primary: Internal Medicine

## 2021-08-09 DIAGNOSIS — R109 Unspecified abdominal pain: Secondary | ICD-10-CM

## 2021-10-05 ENCOUNTER — Inpatient Hospital Stay: Admit: 2021-10-05 | Discharge: 2021-10-05 | Payer: MEDICAID | Primary: Internal Medicine

## 2021-10-05 ENCOUNTER — Ambulatory Visit: Admit: 2021-10-05 | Payer: MEDICAID | Primary: Internal Medicine

## 2021-10-05 ENCOUNTER — Encounter: Admit: 2021-10-05 | Payer: PRIVATE HEALTH INSURANCE | Primary: Internal Medicine

## 2021-10-05 DIAGNOSIS — R079 Chest pain, unspecified: Secondary | ICD-10-CM

## 2021-10-05 DIAGNOSIS — R9431 Abnormal electrocardiogram [ECG] [EKG]: Secondary | ICD-10-CM

## 2021-10-05 DIAGNOSIS — E785 Hyperlipidemia, unspecified: Secondary | ICD-10-CM

## 2021-10-05 LAB — CBC WITH AUTO DIFFERENTIAL
BKR ASPARTATE AMINOTRANSFERASE (AST): 0.6 % (ref 0.0–1.4)
BKR BUN / CREAT RATIO: 328 x1000/??L — ABNORMAL LOW (ref 150–420)
BKR WAM ABSOLUTE IMMATURE GRANULOCYTES.: 0.01 x 1000/??L (ref 0.00–0.30)
BKR WAM ABSOLUTE LYMPHOCYTE COUNT.: 2.89 x 1000/ÂµL (ref 0.60–3.70)
BKR WAM ABSOLUTE NRBC (2 DEC): 0 x 1000/??L (ref 0.00–1.00)
BKR WAM ANALYZER ANC: 3.48 x 1000/??L (ref 2.00–7.60)
BKR WAM BASOPHIL ABSOLUTE COUNT.: 0.04 x 1000/??L (ref 0.00–1.00)
BKR WAM BASOPHILS: 0.6 % (ref 0.0–1.4)
BKR WAM EOSINOPHIL ABSOLUTE COUNT.: 0.08 x 1000/??L (ref 0.00–1.00)
BKR WAM EOSINOPHILS: 1.1 % (ref 0.0–5.0)
BKR WAM HEMATOCRIT (2 DEC): 43.9 % (ref 35.00–45.00)
BKR WAM HEMOGLOBIN: 13.8 g/dL (ref 11.7–15.5)
BKR WAM IMMATURE GRANULOCYTES: 0.1 % (ref 0.0–1.0)
BKR WAM LYMPHOCYTES: 41.1 % (ref 17.0–50.0)
BKR WAM MCH (PG): 28.2 pg (ref 27.0–33.0)
BKR WAM MCHC: 31.4 g/dL (ref 31.0–36.0)
BKR WAM MCV: 89.6 fL — ABNORMAL HIGH (ref 80.0–100.0)
BKR WAM MONOCYTE ABSOLUTE COUNT.: 0.53 x 1000/??L (ref 0.00–1.00)
BKR WAM MONOCYTES: 7.5 % (ref 4.0–12.0)
BKR WAM MPV: 10.1 fL (ref 8.0–12.0)
BKR WAM NEUTROPHILS: 49.6 % (ref 39.0–72.0)
BKR WAM NUCLEATED RED BLOOD CELLS: 0 % (ref 0.0–1.0)
BKR WAM PLATELETS: 328 x1000/ÂµL (ref 150–420)
BKR WAM RDW-CV: 13.2 % (ref 11.0–15.0)
BKR WAM RED BLOOD CELL COUNT.: 4.9 M/ÂµL (ref 4.00–6.00)
BKR WAM WHITE BLOOD CELL COUNT: 7 x1000/??L (ref <5)

## 2021-10-06 LAB — LIPID PANEL
BKR CHOLESTEROL/HDL RATIO: 3.4 U/L (ref 29–143)
BKR CHOLESTEROL: 169 mg/dL
BKR HDL CHOLESTEROL: 50 mg/dL (ref >=40)
BKR LDL CHOLESTEROL SAMPSON CALCULATED: 104 mg/dL — ABNORMAL HIGH
BKR TRIGLYCERIDES: 79 mg/dL

## 2021-10-06 LAB — COMPREHENSIVE METABOLIC PANEL
BKR A/G RATIO: 1.9 % (ref 1.0–2.2)
BKR ALANINE AMINOTRANSFERASE (ALT): 27 U/L (ref 10–35)
BKR ALBUMIN: 4.6 g/dL (ref 3.6–4.9)
BKR ALKALINE PHOSPHATASE: 73 U/L (ref 9–122)
BKR ANION GAP: 11 (ref 7–17)
BKR AST/ALT RATIO: 0.9
BKR BILIRUBIN TOTAL: 0.5 mg/dL (ref <=1.2)
BKR BLOOD UREA NITROGEN: 8 mg/dL (ref 6–20)
BKR CALCIUM: 10 mg/dL (ref 8.8–10.2)
BKR CHLORIDE: 107 mmol/L (ref 98–107)
BKR CO2: 26 mmol/L (ref 20–30)
BKR CREATININE: 1.08 mg/dL (ref 0.40–1.30)
BKR EGFR, CREATININE (CKD-EPI 2021): >60 mL/min/1.73m2 (ref >=60–3.70)
BKR GLOBULIN: 2.4 g/dL (ref 2.3–3.5)
BKR GLUCOSE: 106 mg/dL — ABNORMAL HIGH (ref 70–100)
BKR POTASSIUM: 4.6 mmol/L (ref 3.3–5.3)
BKR PROTEIN TOTAL: 7 g/dL (ref 6.6–8.7)
BKR SODIUM: 144 mmol/L (ref 136–144)

## 2021-10-09 LAB — CK TOTAL + ISOENZYME (BH GH YH)
CK-MB: 0 % (ref <5)
CK-MM: 100 % (ref 95–100)
CREATINE KINASE, TOTAL: 78 U/L (ref 29–143)

## 2021-10-10 ENCOUNTER — Encounter: Admit: 2021-10-10 | Payer: PRIVATE HEALTH INSURANCE | Attending: Otolaryngology | Primary: Internal Medicine

## 2021-10-10 ENCOUNTER — Ambulatory Visit: Admit: 2021-10-10 | Payer: MEDICAID | Attending: Otolaryngology | Primary: Internal Medicine

## 2021-10-10 DIAGNOSIS — R600 Localized edema: Secondary | ICD-10-CM

## 2021-10-10 DIAGNOSIS — K219 Gastro-esophageal reflux disease without esophagitis: Secondary | ICD-10-CM

## 2021-10-10 NOTE — Progress Notes
[place] Res[facility] ClinicThis is Michelle Barron a 56 y.o. female presenting to the ENT clinic as a new patient for evaluation of intermittent bilateral submandiublar swelling x 1 year. She states that the last episode happened approximately 1 month ago, and lasted 1 day. During these episodes she does not have any pain, but does endorse some increased saliva production. She has mild difficulty breathing during these episodes but no fever, chills, sweats, night sweats, or other symptoms. She does not endorse and exacerbating or alleviating symptoms, stating that these episodes resolve on their own in 1 day without any intervention. She does have long term history of dry mouth. Past Medical History: Diagnosis Date ? GERD (gastroesophageal reflux disease)  Past Surgical History: Procedure Laterality Date ? APPENDECTOMY  20 yrs Social History Socioeconomic History ? Marital status: Divorced   Spouse name: Not on file ? Number of children: Not on file ? Years of education: Not on file ? Highest education level: Not on file Occupational History ? Not on file Tobacco Use ? Smoking status: Never ? Smokeless tobacco: Never Vaping Use ? Vaping Use: Never used Substance and Sexual Activity ? Alcohol use: No ? Drug use: Not on file ? Sexual activity: Not on file Other Topics Concern ? Not on file Social History Narrative ? Not on file Social Determinants of Health Financial Resource Strain: Not on file Food Insecurity: Not on file Transportation Needs: Not on file Physical Activity: Not on file Stress: Not on file Social Connections: Not on file Intimate Partner Violence: Not on file Housing Stability: Not on file Prior to Admission medications  Medication Sig Start Date End Date Taking? Authorizing Provider tenofovir disoproxil (VIREAD) 300 mg tablet TAKE 1 TABLET BY MOUTH EVERY DAY 09/05/19  Yes Provider, Historical amoxicillin (AMOXIL) 500 MG capsule Take 500 mg by mouth 3 (three) times daily.Patient not taking: No sig reported    Provider, Historical chlorhexidine gluconate (PERIDEX) 0.12 % solution Swish 1 capful in mouth for 1 minute and spit out once a day in the evening and after brushingPatient not taking: Reported on 10/10/2021 02/28/21   Vickii Penna, DMD oxyCODONE-acetaminophen (PERCOCET) 10-650 mg per tablet Take 1 tablet by mouth every 6 (six) hours as needed.Patient not taking: No sig reported    Provider, Historical polyethylene glycol (MIRALAX) 17 gram packet Take 1 packet (17 g total) by mouth daily. Mix in 8 ounces of water, juice, soda, coffee or tea prior to taking.Patient not taking: No sig reported 10/09/20   Viona Gilmore, MD No Known AllergiesBP (!) 153/84  - Pulse 78  - Temp 98.1 ?F (36.7 ?C) (Temporal)  - Resp 18  - SpO2 100% Lab Results Component Value Date  WBC 7.0 10/05/2021  HGB 13.8 10/05/2021  HCT 43.90 10/05/2021  MCV 89.6 10/05/2021  PLT 328 10/05/2021 Lab Results Component Value Date  CREATININE 1.08 10/05/2021  BUN 8 10/05/2021  NA 144 10/05/2021  K 4.6 10/05/2021  CL 107 10/05/2021  CO2 26 10/05/2021 ________________________________________________________________________Physical Exam:Constitutional: The patient was well-developed and well-nourished and in no apparent distress. The patient communicated by phonation with a clear voice.  Head and Face: There were no cutaneous lesions of the head or face. Eyes: Extraocular movements were intact with no nystagmus.   Ears: The auricles were normal bilaterally. The external auditory canals were clear and the tympanic membranes were flat without any evidence of retraction or effusion.  Nose: The external nose appeared normal. Anterior rhinoscopy demonstrated an  anterior leftward septal deviation with bilateral hypertrophied inferior turbinates.  Oral Cavity/Oropharynx: The oral cavity showed no mucosal abnormalities.  The teeth and gums were unremarkable.  The floor of mouth and oral tongue were soft.  The tongue was fully mobile and protruded midline. The bilateral whartons ducts appeared to be patent and clear saliva was easily expressed bilaterally with bimanual palpation. There were no mucosal abnormalities of the oropharynx. Neurologic: Cranial nerves were intact grossly bilaterally. Neck/Lymphatic: There were no palpable lymph nodes or masses in the lateral neck or central compartment. There were no thyroid masses palpated. There were no palpable parotid or submandibular salivary gland masses. Respiratory: The patient is breathing comfortably.  ________________________________________________________________________A/P: This is [name] 56 y.o. female with history of GERD who presents with 1 year history of intermittent non-painful bilateral submandibular swelling. Examination benign at time of evaluation, however further diagnostic workup with ultrasound would be beneficial. - US neck- RTC when complete - Call clinic to schedule appointment if any new/worsening symptoms in interim - Patient counseled appropriate management regarding their disease management.- Patient to return to care if any increase or change in symptoms are noted. Electronically Signed by Darron Doom, MD, October 10, 2021

## 2021-10-11 DIAGNOSIS — R609 Edema, unspecified: Secondary | ICD-10-CM

## 2021-10-30 ENCOUNTER — Inpatient Hospital Stay: Admit: 2021-10-30 | Discharge: 2021-10-30 | Payer: MEDICAID | Primary: Internal Medicine

## 2021-10-30 DIAGNOSIS — R600 Localized edema: Secondary | ICD-10-CM

## 2021-10-30 DIAGNOSIS — R609 Edema, unspecified: Secondary | ICD-10-CM

## 2021-11-16 ENCOUNTER — Encounter: Admit: 2021-11-16 | Payer: PRIVATE HEALTH INSURANCE | Primary: Internal Medicine

## 2021-11-16 ENCOUNTER — Ambulatory Visit: Admit: 2021-11-16 | Payer: MEDICAID | Primary: Internal Medicine

## 2021-11-16 DIAGNOSIS — K219 Gastro-esophageal reflux disease without esophagitis: Secondary | ICD-10-CM

## 2021-11-16 DIAGNOSIS — R600 Localized edema: Secondary | ICD-10-CM

## 2021-11-16 NOTE — Progress Notes
[place] [facility]  [facility] ClinicThis is Michelle Barron a 56 y.o. female presenting to the ENT clinic as a follow up patient. She states that the last episode happened approximately 1 month ago, and lasted 1 day. During these episodes she does not have any pain, but does endorse some increased saliva production. ?She has mild difficulty breathing during these episodes but no fever, chills, sweats, night sweats, or other symptoms. ?She does not endorse and exacerbating or alleviating symptoms, stating that these episodes resolve on their own in 1 day without any intervention. ?She does have long term history of dry mouth. Interim history:Patient reports one further episode of mild submandibular swellingNo fevers, no weight loss, dysphagia, or odynophagiaUS scan 10/30/21 shows unremarkable SMG, slightly prominent level 2 lymph nodes measuring Medical History: Diagnosis Date ? GERD (gastroesophageal reflux disease)  Past Surgical History: Procedure Laterality Date ? APPENDECTOMY  20 yrs Social History Socioeconomic History ? Marital status: Divorced   Spouse name: Not on file ? Number of children: Not on file ? Years of education: Not on file ? Highest education level: Not on file Occupational History ? Not on file Tobacco Use ? Smoking status: Never ? Smokeless tobacco: Never Vaping Use ? Vaping Use: Never used Substance and Sexual Activity ? Alcohol use: No ? Drug use: Not on file ? Sexual activity: Not on file Other Topics Concern ? Not on file Social History Narrative ? Not on file Social Determinants of Health Financial Resource Strain: Not on file Food Insecurity: Not on file Transportation Needs: Not on file Physical Activity: Not on file Stress: Not on file Social Connections: Not on file Intimate Partner Violence: Not on file Housing Stability: Not on file Prior to Admission medications  Medication Sig Start Date End Date Taking? Authorizing Provider amoxicillin (AMOXIL) 500 MG capsule Take 500 mg by mouth 3 (three) times daily.Patient not taking: No sig reported    Provider, Historical chlorhexidine gluconate (PERIDEX) 0.12 % solution Swish 1 capful in mouth for 1 minute and spit out once a day in the evening and after brushingPatient not taking: Reported on 10/10/2021 02/28/21   Vickii Penna, DMD fluticasone propionate (FLONASE) 50 mcg/actuation nasal spray Use 2 sprays in each nostril daily. 11/07/21   Reginal Lutes, MD oxyCODONE-acetaminophen (PERCOCET) 10-650 mg per tablet Take 1 tablet by mouth every 6 (six) hours as needed.Patient not taking: No sig reported    Provider, Historical polyethylene glycol (MIRALAX) 17 gram packet Take 1 packet (17 g total) by mouth daily. Mix in 8 ounces of water, juice, soda, coffee or tea prior to taking.Patient not taking: No sig reported 10/09/20   Viona Gilmore, MD tenofovir disoproxil (VIREAD) 300 mg tablet TAKE 1 TABLET BY MOUTH EVERY DAY 09/05/19   Provider, Historical No Known AllergiesBP 122/77  - Pulse 86  - Temp 97.4 ?F (36.3 ?C) (Temporal)  - Resp 18  - SpO2 100% Lab Results Component Value Date  WBC 7.0 10/05/2021  HGB 13.8 10/05/2021  HCT 43.90 10/05/2021  MCV 89.6 10/05/2021  PLT 328 10/05/2021 Lab Results Component Value Date  CREATININE 1.08 10/05/2021  BUN 8 10/05/2021  NA 144 10/05/2021  K 4.6 10/05/2021  CL 107 10/05/2021  CO2 26 10/05/2021 ________________________________________________________________________Physical Exam:Constitutional: The patient was well-developed and well-nourished and in no apparent distress. The patient communicated by phonation with a clear voice.  Head and Face: There were no cutaneous lesions of the head or face. Eyes: Extraocular movements were intact with no  nystagmus.   Ears: The auricles were normal bilaterally. The external auditory canals were clear and the tympanic membranes were flat without any evidence of retraction or effusion.  Nose: The external nose appeared normal. Anterior rhinoscopy demonstrated a midline nasal septum with normal turbinates.  Oral Cavity/Oropharynx: The oral cavity showed no mucosal abnormalities.  The teeth and gums were unremarkable.  The floor of mouth and oral tongue were soft.  The tongue was fully mobile and protruded midline. The bilateral whartons ducts appeared to be patent and clear saliva was easily expressed bilaterally with bimanual palpation. There were no mucosal abnormalities of the oropharynx. Bilateral mandibular toriNeurologic: Cranial nerves were intact grossly bilaterally. Neck/Lymphatic: There were no palpable lymph nodes or masses in the lateral neck or central compartment. There were no thyroid masses palpated. There were no palpable parotid or submandibular salivary gland masses. Respiratory: The patient is breathing comfortably.  ________________________________________________________________________Appropriate imaging has been reviewed.A/P: This is Michelle Barron 56 y.o. female with history of GERD who presents with 1 year history of intermittent non-painful bilateral submandibular swelling, with normal ultrasound of neck. Benign exam and no red flag symptoms. - RTC PRN- Call clinic to schedule appointment if any new/worsening symptoms-- Patient counseled appropriate management regarding their disease management.- Patient to return to care if any increase or change in symptoms are noted. Electronically Signed by Annamaria Helling, MD, March 23, 2023OTOLARYNGOLOGY ATTENDING NOTEI attest that I have evaluated the patient and agree with the resident's assessment and plan, and have made appropriate edits to the note.Mervyn Gay, MDElectronically Signed by Mervyn Gay, MD, 11/16/2021, 3:54 PM

## 2021-11-17 DIAGNOSIS — R609 Edema, unspecified: Secondary | ICD-10-CM

## 2021-12-13 ENCOUNTER — Encounter: Admit: 2021-12-13 | Payer: MEDICAID | Attending: Gynecology | Primary: Internal Medicine

## 2021-12-14 ENCOUNTER — Encounter: Admit: 2021-12-14 | Payer: MEDICAID | Attending: Gynecology | Primary: Internal Medicine

## 2021-12-21 ENCOUNTER — Encounter: Admit: 2021-12-21 | Payer: PRIVATE HEALTH INSURANCE | Primary: Internal Medicine

## 2021-12-22 ENCOUNTER — Ambulatory Visit: Admit: 2021-12-22 | Payer: MEDICAID | Attending: Gynecology | Primary: Internal Medicine

## 2021-12-22 DIAGNOSIS — B379 Candidiasis, unspecified: Secondary | ICD-10-CM

## 2021-12-22 DIAGNOSIS — Z01419 Encounter for gynecological examination (general) (routine) without abnormal findings: Secondary | ICD-10-CM

## 2021-12-22 MED ORDER — TERCONAZOLE 80 MG VAGINAL SUPPOSITORY
80 mg | 1 refills | Status: AC
Start: 2021-12-22 — End: ?

## 2021-12-22 NOTE — Progress Notes
WRU:EAVWU Michelle Barron is a 56 y.o. G3P2 female who presents to this practice for an annual exam.No LMP recorded. Patient is postmenopausal.Patient History?	Problem List: has Microhematuria; Depression; Breast calcification, left; and Submandibular swelling on their problem list. ?	Past Surgical History: has a past surgical history that includes Appendectomy (20 yrs).?	Past Medical History: has a past medical history of GERD (gastroesophageal reflux disease).?	Family History: family history is not on file.?	Allergies:has No Known Allergies. Medications: Current Outpatient Medications: ?  amoxicillin, 500 mg, Oral, TID (Patient not taking: No sig reported)?  chlorhexidine gluconate, Swish 1 capful in mouth for 1 minute and spit out once a day in the evening and after brushing (Patient not taking: Reported on 10/10/2021)?  fluticasone propionate, 2 spray, each nostril, Daily?  oxyCODONE-acetaminophen, 1 tablet, Oral, Q6H PRN (Patient not taking: No sig reported)?  polyethylene glycol, 17 g, Oral, Daily (Patient not taking: No sig reported)?	?  tenofovir disoproxil, TAKE 1 TABLET BY MOUTH EVERY DAY ?	Social History: reports that she has never smoked. She has never used smokeless tobacco. She reports that she does not drink alcohol. ?	Intimate Partner Violence  No?	Menstrual/Sexual History: ?	Ob Hx G3P2 NSVD ?	Sexually active No?	Contraception N/A?	Dyspareunia Hx Deep inside ?	Cycles N/A?	Voids < 10?	Nocturia  5 x drinks ?	Menopausal Symptoms No?	USI No ?	Vulva issues No?	BM every other day pellet like stools?	C/o Odor?	[place] AB/Pelvis Normal Pelvis 02/14/2021?	ROS OB History Gravida Para Term Preterm AB Living 3 2         SAB IAB Ectopic Molar Multiple Live Births           2  # Outcome Date GA Lbr Len/2nd Weight Sex Delivery Anes PTL Lv 3 Gravida          2 Para          1 Para          Objective: ?	BP 123/82  - Pulse 80  - Temp 98.1 ?F (36.7 ?C) (Oral)  - Ht 5' 1 (1.549 m)  - Wt 76.1 kg  - SpO2 100%  - BMI 31.71 kg/m?  Physical Exam?	General:  Appears well, no distress	?	Lungs: ?	Thyroid:normal to inspection and palpation?	Heart: ?	Breast Exam:Normal appearance, no masses or tenderness?	Abdomen: soft, nontender, no masses palpated, no hepatosplenomegaly	?	External Genitalia: General appearance; normal?		?	Uterus Uterine size is 6 weeks and Uterus is  midposition?	        Vagina: Normal appearanceAtrophy notedEvidence of infection:  yes - CandidaKegel squeeze: 2-3/4	?	Cervix Normal			  ?	Adnexa: no palpable mass and no tenderness      Rectal:   Health Maintenance: Colonoscopy:  Up to dateDexa Bone Density:  N/AMammogram:  Up to dateBreast [place]: N/APAP:  Up to date Due 2028Assessment / Plan: Michelle Barron is a 56 y.o. G3P2 femaleImp 1) LLQ pain IBS/Divrerticulosis         2) s/p Right Eye Excision cyst 2013         3) Candida           4) Constipation Plan 1) Terazol 3         2) Warm Prune JuivePatient Education:?	Specific topics reviewed: ?	?	?	Exam Chaperoned by Arlete

## 2022-01-11 ENCOUNTER — Encounter: Admit: 2022-01-11 | Payer: PRIVATE HEALTH INSURANCE | Attending: Gastroenterology | Primary: Internal Medicine

## 2022-01-11 DIAGNOSIS — B181 Chronic viral hepatitis B without delta-agent: Secondary | ICD-10-CM

## 2022-03-04 ENCOUNTER — Inpatient Hospital Stay: Admit: 2022-03-04 | Discharge: 2022-03-04 | Payer: MEDICAID | Primary: Internal Medicine

## 2022-03-04 DIAGNOSIS — B181 Chronic viral hepatitis B without delta-agent: Secondary | ICD-10-CM

## 2022-03-04 MED ORDER — GADOXETATE 0.25 MMOL/ML (181.43 MG/ML) INTRAVENOUS SOLUTION
0.25 mmol/mL (181.43 mg/mL) | Freq: Once | INTRAVENOUS | Status: CP | PRN
Start: 2022-03-04 — End: ?
  Administered 2022-03-04: 16:00:00 0.25 mL via INTRAVENOUS

## 2022-03-05 ENCOUNTER — Ambulatory Visit: Admit: 2022-03-05 | Payer: MEDICAID | Primary: Internal Medicine

## 2022-04-26 ENCOUNTER — Ambulatory Visit: Admit: 2022-04-26 | Payer: MEDICAID | Attending: Gynecology | Primary: Internal Medicine

## 2022-04-26 DIAGNOSIS — N3090 Cystitis, unspecified without hematuria: Secondary | ICD-10-CM

## 2022-04-26 DIAGNOSIS — A749 Chlamydial infection, unspecified: Secondary | ICD-10-CM

## 2022-04-26 MED ORDER — SULFAMETHOXAZOLE 800 MG-TRIMETHOPRIM 160 MG TABLET
800-160 mg | ORAL_TABLET | Freq: Two times a day (BID) | ORAL | 1 refills | Status: AC
Start: 2022-04-26 — End: ?

## 2022-04-26 MED ORDER — PHENAZOPYRIDINE 200 MG TABLET
200 mg | ORAL_TABLET | Freq: Three times a day (TID) | ORAL | 1 refills | Status: AC
Start: 2022-04-26 — End: ?

## 2022-04-26 NOTE — Progress Notes
GYN PROBLEM VISITHPI:Michelle Barron is a 56 y.o. G3P2 female who presents complaining of vaginal painC/o burning  with urination going small amounts Started back intercourse recently  ?	[place] AB/Pelvis Normal Pelvis 6/21/2022No LMP recorded. Patient is postmenopausal.Review of Symptoms:  Patient History?	Problem List: has Microhematuria; Depression; Breast calcification, left; and Submandibular swelling on their problem list. ?	Past Surgical History: has a past surgical history that includes Appendectomy (20 yrs).?	Past Medical History: has a past medical history of GERD (gastroesophageal reflux disease).?	Allergies:has No Known Allergies. Medications: Current Outpatient Medications: ?  busPIRone, TAKE 1 TABLET BY MOUTH 1-2 TIMES A DAY AS NEEDED?  chlorhexidine gluconate, Swish 1 capful in mouth for 1 minute and spit out once a day in the evening and after brushing (Patient not taking: Reported on 10/10/2021)?  fluticasone propionate, 2 spray, each nostril, Daily?  polyethylene glycol, 17 g, Oral, Daily (Patient not taking: No sig reported)?  tenofovir disoproxil, TAKE 1 TABLET BY MOUTH EVERY DAY?	?  terconazole, I suppository  hs x 3 ?	Social History:  reports that she has never smoked. She has never used smokeless tobacco. She reports that she does not drink alcohol.OB History Gravida Para Term Preterm AB Living 3 2         SAB IAB Ectopic Molar Multiple Live Births           2  # Outcome Date GA Lbr Len/2nd Weight Sex Delivery Anes PTL Lv 3 Gravida          2 Para          1 Para          Exam:?	BP 131/84  - Pulse 76  - Resp 20  - Ht 5' 1 (1.549 m)  - Wt 72.9 kg  - SpO2 100%  - BMI 30.38 kg/m?     04/26/2022   9:17 AM 03/13/2022  11:17 AM 12/22/2021   1:06 PM Last 3 weights Weight (lb) 160 lb 12.8 oz 160 lb 167 lb 12.8 oz Weight (kg) 72.938 kg 72.576 kg 76.114 kg  ?	?	General:  Appears well, No distress Abdomen: soft, Non-tender, non-distended	      Pelvic Exam      External Genitalia: BUS Normal      Vagina No lesions noted       Bladder tender       Cervix Clear      Uterus  8 mid non tender      Adnexa: no masses non tender	Assessment & PlanImp 1)?Cystitis????????2) s/p Right Eye Excision?cyst?2013        3) Hx Candida         4) Constipation          5) Hx Chronic Viral Hepatitis           6) LLQ pain IBS/Divrerticulosis??Plan 1) Bactrim/Pyridium         2) STD testing Exam Chaperoned by Vickii Penna

## 2022-04-27 LAB — TRICHOMONAS VAGINALIS BY NAAT     (L YH): BKR TRICHOMONAS VAGINALIS NAAT: NEGATIVE

## 2022-04-27 LAB — NEISSERIA GONORRHEA, NAAT     (BH GH L LMW YH): BKR NEISSERIA GONORRHOEAE, DNA PROBE: NEGATIVE

## 2022-04-27 LAB — URINE CULTURE

## 2022-04-27 LAB — CHLAMYDIA TRACHOMATIS, NAAT     (BH GH LMW YH): BKR CHLAMYDIA, DNA PROBE: POSITIVE — AB

## 2022-04-27 MED ORDER — DOXYCYCLINE HYCLATE 100 MG CAPSULE
100 mg | ORAL_CAPSULE | Freq: Two times a day (BID) | ORAL | 1 refills | Status: AC
Start: 2022-04-27 — End: ?

## 2022-04-27 NOTE — Other
Pt notified of chlamydia -instructed to pick up abx and use as directed. Instructed to inform partner so they can receive treatment, advised to refrain from intercourse until both finish the 7 day treatment, toc set up, reported to dph

## 2022-05-08 ENCOUNTER — Telehealth: Admit: 2022-05-08 | Payer: PRIVATE HEALTH INSURANCE | Attending: Gynecology | Primary: Internal Medicine

## 2022-05-08 ENCOUNTER — Telehealth: Admit: 2022-05-08 | Payer: PRIVATE HEALTH INSURANCE | Attending: Urology | Primary: Internal Medicine

## 2022-05-08 NOTE — Telephone Encounter
Patient states she was diagnosed 2 weeks ago with chlamydia - when she first started taking the doxycyline she vomited up the pills - she started taking with food and was able to finish script - pt would like to be retested. Informed it may be too soon but will ask md.

## 2022-05-08 NOTE — Telephone Encounter
.  [facility] MESSAGETime of call:   10:29 AMCaller:   MariaCaller's relationship to patient:  self  Calling from (pharmacy, hospital, agency, etc.):  n/a   Reason for call:   Not feeling wellIf not feeling well, what are symptoms:  Back pain, frequent urination, possible fever but pt not sure. Pt also states 10/10 pain for back pain but hasn't taken any pain reliever due to liver problem. Worse at night.     If having symptoms, how long have the symptoms been present:  Started last week.    Does caller request to speak to someone urgently?  no   If yes, warm transferred to:  n/aProvider:  Gray Bernhardt APRN Last clinic visit date:  07/17/2021	Best telephone number for callback:   [phone] Best time to return call (encourage patient to be available for callback):   Anytime  Permission to leave message:  no   Gertie Gowda [facility] Scheduler

## 2022-05-08 NOTE — Telephone Encounter
Patient called requesting to talk about her symptoms.Please call her back

## 2022-05-09 NOTE — Telephone Encounter
Contacted [name]. She just finished antibiotics per gynecology for recently diagnosed chlamydia infection. Her symptoms are the same as before she started antibiotics. At that time, a urine culture was sent and found to be negative. She will follow up with gynecology.

## 2022-05-09 NOTE — Telephone Encounter
Pt notified and appt set up for 10/4

## 2022-05-30 ENCOUNTER — Ambulatory Visit: Admit: 2022-05-30 | Payer: MEDICAID | Attending: Gynecology | Primary: Internal Medicine

## 2022-05-30 DIAGNOSIS — A749 Chlamydial infection, unspecified: Secondary | ICD-10-CM

## 2022-05-30 NOTE — Progress Notes
GYN  Visit Michelle Barron is a 56 y.o. G3P2 female who presents for follow up regarding TOC Chlamydia Patient has a Hx IBS Diverticulosis & Constipation No LMP recorded. Patient is postmenopausal.Review of Symptoms:  Patient History?	Problem List: has Microhematuria; Depression; Breast calcification, left; and Submandibular swelling on their problem list. ?	Past Surgical History: has a past surgical history that includes Appendectomy (20 yrs).?	Past Medical History: has a past medical history of GERD (gastroesophageal reflux disease).?	Allergies:has No Known Allergies. Medications: Current Outpatient Medications: ?  busPIRone, TAKE 1 TABLET BY MOUTH 1-2 TIMES A DAY AS NEEDED?  chlorhexidine gluconate, Swish 1 capful in mouth for 1 minute and spit out once a day in the evening and after brushing (Patient not taking: Reported on 10/10/2021)?  doxycycline hyclate, 100 mg, Oral, BID?  fluticasone propionate, 2 spray, each nostril, Daily?  polyethylene glycol, 17 g, Oral, Daily (Patient not taking: No sig reported)?  tenofovir disoproxil, TAKE 1 TABLET BY MOUTH EVERY DAY?	?  terconazole, I suppository  hs x 3 ?	Social History:  reports that she has never smoked. She has never used smokeless tobacco. She reports that she does not drink alcohol.OB History Gravida Para Term Preterm AB Living 3 2         SAB IAB Ectopic Molar Multiple Live Births           2  # Outcome Date GA Lbr Len/2nd Weight Sex Delivery Anes PTL Lv 3 Gravida          2 Para          1 Para          Exam:?	BP (P) 129/80  - Pulse (P) 84  - Temp (P) 97.2 ?F (36.2 ?C) (Oral)  - Resp (P) 18  - Ht (P) 5' 1 (1.549 m)  - Wt (P) 74.6 kg  - SpO2 (P) 99%  - BMI (P) 31.06 kg/m?     05/30/2022   3:20 PM 04/26/2022   9:17 AM 03/13/2022  11:17 AM Last 3 weights Weight (lb) 164 lb 6.4 oz 160 lb 12.8 oz 160 lb Weight (kg) 74.571 kg 72.938 kg 72.576 kg  ?	?	General:  Appears well, No distress	      Assessment & PlanImp 1)? Hx Chlamydia 04/26/2022????????2) s/p Right Eye Excision?cyst?2013????????3) Hx Candida ????????4) Constipation          5) Hx Chronic Viral Hepatitis           6) LLQ?pain IBS/Divrerticulosis?Plan 1) TOC Chlamydia          2) f/u PCP LLQ pain Patient exam or treatment required medical chaperone.The sensitive parts of the examination were performed with chaperone present: Yes; Chaperone Name, Role/Title: Arlete MA

## 2022-05-31 ENCOUNTER — Telehealth: Admit: 2022-05-31 | Payer: PRIVATE HEALTH INSURANCE | Attending: Gynecology | Primary: Internal Medicine

## 2022-05-31 LAB — CHLAMYDIA TRACHOMATIS, NAAT     (BH GH LMW YH): BKR CHLAMYDIA, DNA PROBE: NEGATIVE

## 2022-05-31 LAB — TRICHOMONAS VAGINALIS BY NAAT     (L YH): BKR TRICHOMONAS VAGINALIS NAAT: NEGATIVE

## 2022-05-31 LAB — NEISSERIA GONORRHEA, NAAT     (BH GH L LMW YH): BKR NEISSERIA GONORRHOEAE, DNA PROBE: NEGATIVE

## 2022-05-31 NOTE — Telephone Encounter
Spoke to patient - informed only trich result is back as negative - the GC/[place] part is still in process - will call her back once resulted

## 2022-05-31 NOTE — Telephone Encounter
PT WAS CALLING FOR HER TEST RESULTS

## 2022-06-02 ENCOUNTER — Encounter: Admit: 2022-06-02 | Payer: PRIVATE HEALTH INSURANCE | Attending: Internal Medicine | Primary: Internal Medicine

## 2022-06-02 DIAGNOSIS — R413 Other amnesia: Secondary | ICD-10-CM

## 2022-06-04 ENCOUNTER — Encounter: Admit: 2022-06-04 | Payer: PRIVATE HEALTH INSURANCE | Attending: Internal Medicine | Primary: Internal Medicine

## 2022-06-04 DIAGNOSIS — Z1231 Encounter for screening mammogram for malignant neoplasm of breast: Secondary | ICD-10-CM

## 2022-06-19 ENCOUNTER — Inpatient Hospital Stay: Admit: 2022-06-19 | Discharge: 2022-06-19 | Payer: MEDICAID | Primary: Internal Medicine

## 2022-06-19 ENCOUNTER — Encounter: Admit: 2022-06-19 | Payer: PRIVATE HEALTH INSURANCE | Primary: Internal Medicine

## 2022-06-19 DIAGNOSIS — Z1231 Encounter for screening mammogram for malignant neoplasm of breast: Secondary | ICD-10-CM

## 2022-06-19 DIAGNOSIS — K219 Gastro-esophageal reflux disease without esophagitis: Secondary | ICD-10-CM

## 2022-06-19 DIAGNOSIS — H04129 Dry eye syndrome of unspecified lacrimal gland: Secondary | ICD-10-CM

## 2022-07-05 ENCOUNTER — Encounter: Admit: 2022-07-05 | Payer: PRIVATE HEALTH INSURANCE | Attending: Ophthalmology | Primary: Internal Medicine

## 2022-07-05 ENCOUNTER — Ambulatory Visit: Admit: 2022-07-05 | Payer: MEDICAID | Attending: Ophthalmology | Primary: Internal Medicine

## 2022-07-05 DIAGNOSIS — K219 Gastro-esophageal reflux disease without esophagitis: Secondary | ICD-10-CM

## 2022-07-05 DIAGNOSIS — H04123 Dry eye syndrome of bilateral lacrimal glands: Secondary | ICD-10-CM

## 2022-07-05 DIAGNOSIS — H2513 Age-related nuclear cataract, bilateral: Secondary | ICD-10-CM

## 2022-07-05 DIAGNOSIS — H5203 Hypermetropia, bilateral: Secondary | ICD-10-CM

## 2022-07-05 MED ORDER — RESTASIS MULTIDOSE 0.05 % EYE DROPS
0.05 % | Freq: Two times a day (BID) | OPHTHALMIC | 12 refills | Status: AC
Start: 2022-07-05 — End: ?

## 2022-07-05 NOTE — Patient Instructions
Start systane artificial tears 4 x dayStart systane PM gel at nightStart restasis 1 drop twice daily

## 2022-07-05 NOTE — Progress Notes
Cataracts both eyes- Mild, NVS- ObserveDry eyes both eyes- Using ATs BIDHyperopia with presbyopia- MRx updatedPlan:ATs QID, tear gel at bedtimeStart restasis

## 2022-07-10 ENCOUNTER — Telehealth: Admit: 2022-07-10 | Payer: PRIVATE HEALTH INSURANCE | Attending: Ophthalmology | Primary: Internal Medicine

## 2022-07-10 NOTE — Telephone Encounter
[facility] MESSAGE Time of call:  Caller:   Caller's relationship to patient:   SelfReason for call:   Patient is calling regarding a questions for Restasis.  It says to apply to EYE but she doesn't know, which one. When asked which eye is she having a problem with, she stated the right one.  She'd like to confirm.What are the symptoms if ZOX:WRUE telephone number for callback:   Best time to return call:     Permission to leave message:

## 2022-07-10 NOTE — Telephone Encounter
Hi Dr. Ace Gins, Spoke with Michelle Barron, she is asking if she should start Restasis OU? Or OD?She was indicated per last note:Plan:ATs QID, tear gel at bedtimeStart restasis Please advise.Thanks

## 2022-07-13 NOTE — Telephone Encounter
Spoke with patient, she was indicated per Dr. Ace Gins:Both eyes! Twice daily.She verbalizes understanding.Complete

## 2022-07-16 ENCOUNTER — Encounter: Admit: 2022-07-16 | Payer: PRIVATE HEALTH INSURANCE | Attending: Urology | Primary: Internal Medicine

## 2022-07-16 ENCOUNTER — Ambulatory Visit: Admit: 2022-07-16 | Payer: MEDICAID | Attending: Urology | Primary: Internal Medicine

## 2022-07-16 DIAGNOSIS — R109 Unspecified abdominal pain: Secondary | ICD-10-CM

## 2022-07-16 DIAGNOSIS — R3129 Other microscopic hematuria: Secondary | ICD-10-CM

## 2022-07-16 DIAGNOSIS — K219 Gastro-esophageal reflux disease without esophagitis: Secondary | ICD-10-CM

## 2022-07-16 NOTE — Progress Notes
Follow-up Note Michelle Barron is a 56 y.o. female is here for follow up with established diagnosis microhematuria , RLQ painLast evaluated on 07/17/21 ?Previously followed by Urologist in [place].  Dr Moise Boring.  No records available. Pt reports 2 mos history LLQ pain.  Rates 5/10. She tries to avoid tylenol and advil.  Has taken when discomfort increasingly bothersome.  No gross hematuria, no fever.  Occasional chill.Reviewed previous work upUrine cytology: negativeCT nephrogram: no stones no hydro. Mild urinary bladder wall thickening is nonspecificCystoscopy with Dr Mariana Single : showed a few glomerulations. No evidence of tumor or stones. Normal expansion. Retroflex exam normal2/13/22 [place]: focal hyperenhancement in R liver lobe peripherally measuring up to 1.2 cm /recommend MRIMRI abdomen consistent with hepatic steatosis and liver hemangiomaHPIMaria  reports that she has never smoked. She has never used smokeless tobacco. She reports that she does not drink alcohol. No history on file for drug use.  Past Medical History: Diagnosis Date ? GERD (gastroesophageal reflux disease)  Past Surgical History: Procedure Laterality Date ? APPENDECTOMY  20 yrs No Known AllergiesReview Of SystemsReview of SystemsGU: see HPIPhysical ExamHt 5' 1 (1.549 m)  - Wt 74.4 kg  - BMI 30.99 kg/m? Lab Results Component Value Date  UCOLOR yellow 07/16/2022  UGLUCOSE Negative 07/16/2022  UKETONE Negative 07/16/2022  USPECGRAVITY 1.030 07/16/2022  UPROTEIN Negative 07/16/2022  UNITRATES Negative 07/16/2022  UBLOOD 2+ 07/16/2022  ULEUKOCYTES Negative 07/16/2022    No results found for: PVRPOCTPhysical ExamPhysical Exam Constitutional: oriented to person, place, and time.  appears well-developed and well-nourished. HENT: Head: Normocephalic. Eyes: No scleral icterus. Cardiovascular: Normal rate.  Pulmonary/Chest: Effort normal. Abdominal: Soft. Musculoskeletal: Normal range of motion. Lymphadenopathy:    no cervical adenopathy. Neurological: alert and oriented to person, place, and time. Skin: Skin is warm and dry. Psychiatric: Has a normal mood and affect. Her behavior is normal. Judgment and thought content normal. Assessment & Plan:Michelle Barron is a 56 y.o. female Encounter Diagnoses Name SNOMED [place](R) Primary? ? Microhematuria MICROSCOPIC HEMATURIA Yes ? Disease of urinary tract DISORDER OF URINARY TRACT  ? Left flank pain LEFT FLANK PAIN  Urine cytologyCT abdomen and pelvisFollow up VV to review results and POC [name], APRN

## 2022-07-17 DIAGNOSIS — N399 Disorder of urinary system, unspecified: Secondary | ICD-10-CM

## 2022-07-25 ENCOUNTER — Encounter: Admit: 2022-07-25 | Payer: MEDICAID | Attending: Gynecology | Primary: Internal Medicine

## 2022-07-30 ENCOUNTER — Inpatient Hospital Stay: Admit: 2022-07-30 | Discharge: 2022-07-30 | Payer: MEDICAID | Primary: Internal Medicine

## 2022-07-30 DIAGNOSIS — R109 Unspecified abdominal pain: Secondary | ICD-10-CM

## 2022-07-31 ENCOUNTER — Inpatient Hospital Stay: Admit: 2022-07-31 | Discharge: 2022-07-31 | Payer: MEDICAID | Primary: Internal Medicine

## 2022-07-31 DIAGNOSIS — R413 Other amnesia: Secondary | ICD-10-CM

## 2022-08-15 ENCOUNTER — Encounter: Admit: 2022-08-15 | Payer: PRIVATE HEALTH INSURANCE | Primary: Internal Medicine

## 2022-08-16 ENCOUNTER — Ambulatory Visit: Admit: 2022-08-16 | Payer: MEDICAID | Attending: Urology | Primary: Internal Medicine

## 2022-09-27 ENCOUNTER — Ambulatory Visit: Admit: 2022-09-27 | Payer: MEDICAID | Attending: Urology | Primary: Internal Medicine

## 2022-10-29 ENCOUNTER — Telehealth: Admit: 2022-10-29 | Payer: PRIVATE HEALTH INSURANCE | Attending: Internal Medicine | Primary: Internal Medicine

## 2022-10-29 NOTE — Telephone Encounter
She is asking for another apt in entNot seen since 2022-states she is having same issues

## 2022-11-21 ENCOUNTER — Ambulatory Visit: Admit: 2022-11-21 | Payer: MEDICAID | Attending: Gynecology | Primary: Internal Medicine

## 2022-11-21 DIAGNOSIS — R300 Dysuria: Secondary | ICD-10-CM

## 2022-11-21 DIAGNOSIS — R350 Frequency of micturition: Secondary | ICD-10-CM

## 2022-11-21 MED ORDER — PHENAZOPYRIDINE 200 MG TABLET
200 mg | ORAL_TABLET | Freq: Three times a day (TID) | ORAL | 1 refills | Status: AC
Start: 2022-11-21 — End: ?

## 2022-11-21 MED ORDER — SULFAMETHOXAZOLE 800 MG-TRIMETHOPRIM 160 MG TABLET
800-160 mg | ORAL_TABLET | Freq: Two times a day (BID) | ORAL | 1 refills | Status: AC
Start: 2022-11-21 — End: ?

## 2022-11-21 NOTE — Progress Notes
GYN PROBLEM VISITHPI:Michelle Barron is a 57 y.o. G3P2 female who presents complaining of urinary frequency and dysuria No Hematuria No LMP recorded. Patient is postmenopausal.Review of Symptoms:  Patient HistoryProblem List: has Microhematuria; Depression; Breast calcification, left; and Submandibular swelling on their problem list. Past Surgical History: has a past surgical history that includes Appendectomy (20 yrs).Past Medical History: has a past medical history of GERD (gastroesophageal reflux disease).Allergies:has No Known Allergies. Medications: Current Outpatient Medications:   busPIRone, TAKE 1 TABLET BY MOUTH 1-2 TIMES A DAY AS NEEDED  chlorhexidine gluconate, Swish 1 capful in mouth for 1 minute and spit out once a day in the evening and after brushing (Patient not taking: Reported on 10/10/2021)  Restasis MultiDose, 1 drop, Ophthalmic, BID  doxycycline hyclate, 100 mg, Oral, BID  fluticasone propionate, 2 spray, each nostril, Daily  polyethylene glycol, 17 g, Oral, Daily (Patient not taking: Reported on 02/07/2021)  tenofovir disoproxil, TAKE 1 TABLET BY MOUTH EVERY DAY  terconazole, I suppository  hs x 3 Social History:  reports that she has never smoked. She has never used smokeless tobacco. She reports that she does not drink alcohol.OB History Gravida Para Term Preterm AB Living 3 2         SAB IAB Ectopic Molar Multiple Live Births           2  # Outcome Date GA Lbr Len/2nd Weight Sex Delivery Anes PTL Lv 3 Gravida          2 Para          1 Para          Exam:There were no vitals taken for this visit.    08/21/2022   9:39 AM 07/16/2022  11:22 AM 05/30/2022   3:20 PM Last 3 weights Weight (lb) 164 lb 3.2 oz 164 lb 164 lb 6.4 oz Weight (kg) 74.481 kg 74.39 kg 74.571 kg  General:  Appears well, No distress	      Abdomen: soft, Non-tender, non-distended	      Pelvic Exam External Genitalia: BUS Normal      Vagina Tender Bladder       Cervix Clear      Uterus  6 mid      Adnexa: no masses non tender	Assessment & PlanImp 1) Cystitis         2) LLQ pain IBS/Divrerticulosis          3) s/p Right Eye Excision cyst 2013          4) Constipation               5) Hx Chlamydia 04/26/2022 TOC Negative            6) Hx Chronic Hepatitis B Plan 1) Urine Culture         2) GC/[place]           3) Bactrim/Pyridium .chaperone Arlette Wallace [place] aca

## 2022-11-22 ENCOUNTER — Ambulatory Visit: Admit: 2022-11-22 | Payer: MEDICAID | Attending: Urology | Primary: Internal Medicine

## 2022-11-22 LAB — NEISSERIA GONORRHEA, NAAT     (BH GH L LMW YH): BKR NEISSERIA GONORRHOEAE, DNA PROBE: NEGATIVE

## 2022-11-22 LAB — CHLAMYDIA TRACHOMATIS, NAAT     (BH GH L LMW YH): BKR CHLAMYDIA, DNA PROBE: NEGATIVE

## 2022-11-23 LAB — URINE CULTURE: BKR URINE CULTURE, ROUTINE: NO GROWTH

## 2022-11-23 NOTE — Other
No UTI

## 2022-11-29 ENCOUNTER — Ambulatory Visit: Admit: 2022-11-29 | Payer: MEDICAID | Primary: Internal Medicine

## 2022-11-29 ENCOUNTER — Encounter: Admit: 2022-11-29 | Payer: PRIVATE HEALTH INSURANCE | Primary: Internal Medicine

## 2022-11-29 DIAGNOSIS — H60543 Acute eczematoid otitis externa, bilateral: Secondary | ICD-10-CM

## 2022-11-29 DIAGNOSIS — K219 Gastro-esophageal reflux disease without esophagitis: Secondary | ICD-10-CM

## 2022-11-29 MED ORDER — HYDROCORTISONE 2.5 % TOPICAL CREAM
2.5 % | Freq: Every day | TOPICAL | 3 refills | Status: AC
Start: 2022-11-29 — End: ?

## 2022-11-29 NOTE — Progress Notes
[place] [facility]  [facility] ClinicThis is Michelle Barron a 57 y.o. female presenting to the ENT clinic as a follow up patient. She states that the last episode happened approximately 1 month ago, and lasted 1 day. During these episodes she does not have any pain, but does endorse some increased saliva production. She has mild difficulty breathing during these episodes but no fever, chills, sweats, night sweats, or other symptoms. She does not endorse and exacerbating or alleviating symptoms, stating that these episodes resolve on their own in 1 day without any intervention. She does have long term history of dry mouth. 4/4/2024Patient was last seen 10/2021 for mild bilateral submandibular swelling. [place] at the time was unremarkable and showed slightly prominent level 2 lymph nodes measuring 8mm. Today, she presents with persistent intermittent swelling -- unchanged symptoms, primarily at night. No persistent swelling, neck masses, fevers, no weight loss, dysphagia, or odynophagia. Her primary complaint today is pruritus of the EACs and occasional aural fullness. She denies any otalgia, otorrhea, hearing loss, tinnitus. She does report daily Q-tip use. ROS otherwise unremarkable.  Past Medical History: Diagnosis Date  GERD (gastroesophageal reflux disease)  Past Surgical History: Procedure Laterality Date  APPENDECTOMY  20 yrs Social History Socioeconomic History  Marital status: Divorced   Spouse name: Not on file  Number of children: Not on file  Years of education: Not on file  Highest education level: Not on file Occupational History  Not on file Tobacco Use  Smoking status: Never  Smokeless tobacco: Never Vaping Use  Vaping Use: Never used Substance and Sexual Activity  Alcohol use: No  Drug use: Not on file  Sexual activity: Not on file Other Topics Concern  Not on file Social History Narrative  Not on file Social Determinants of Health Financial Resource Strain: Not on file Food Insecurity: Not on file Transportation Needs: Not on file Physical Activity: Not on file Stress: Not on file Social Connections: Not on file Intimate Partner Violence: Not on file Housing Stability: Not on file Prior to Admission medications  Medication Sig Start Date End Date Taking? Authorizing Provider amoxicillin (AMOXIL) 500 MG capsule Take 500 mg by mouth 3 (three) times daily.Patient not taking: No sig reported    Provider, Historical chlorhexidine gluconate (PERIDEX) 0.12 % solution Swish 1 capful in mouth for 1 minute and spit out once a day in the evening and after brushingPatient not taking: Reported on 10/10/2021 02/28/21   Vickii Penna, DMD fluticasone propionate (FLONASE) 50 mcg/actuation nasal spray Use 2 sprays in each nostril daily. 11/07/21   Reginal Lutes, MD oxyCODONE-acetaminophen (PERCOCET) 10-650 mg per tablet Take 1 tablet by mouth every 6 (six) hours as needed.Patient not taking: No sig reported    Provider, Historical polyethylene glycol (MIRALAX) 17 gram packet Take 1 packet (17 g total) by mouth daily. Mix in 8 ounces of water, juice, soda, coffee or tea prior to taking.Patient not taking: No sig reported 10/09/20   Viona Gilmore, MD tenofovir disoproxil (VIREAD) 300 mg tablet TAKE 1 TABLET BY MOUTH EVERY DAY 09/05/19   Provider, Historical No Known AllergiesThere were no vitals taken for this visit.Lab Results Component Value Date  WBC 7.0 10/05/2021  HGB 13.8 10/05/2021  HCT 43.90 10/05/2021  MCV 89.6 10/05/2021  PLT 328 10/05/2021 Lab Results Component Value Date  CREATININE 1.08 10/05/2021  BUN 8 10/05/2021  NA 144 10/05/2021  K 4.6 10/05/2021  CL 107 10/05/2021  CO2 26 10/05/2021 ________________________________________________________________________Physical Exam:Constitutional: The patient  was well-developed and well-nourished and in no apparent distress. The patient communicated by phonation with a clear voice.  Head and Face: There were no cutaneous lesions of the head or face. Eyes: Extraocular movements were intact with no nystagmus.   Ears: The auricles were normal bilaterally. The external auditory canals were clear and the tympanic membranes were flat without any evidence of retraction or effusion.  Nose: The external nose appeared normal. Anterior rhinoscopy demonstrated a midline nasal septum with normal turbinates.  Oral Cavity/Oropharynx: The oral cavity showed no mucosal abnormalities.  The teeth and gums were unremarkable.  The floor of mouth and oral tongue were soft.  The tongue was fully mobile and protruded midline. The bilateral whartons ducts appeared to be patent and clear saliva was easily expressed bilaterally with bimanual palpation. There were no mucosal abnormalities of the oropharynx. Bilateral mandibular tori.Neurologic: Cranial nerves were intact grossly bilaterally. Neck/Lymphatic: There were no palpable lymph nodes or masses in the lateral neck or central compartment. There were no thyroid masses palpated. There were no palpable parotid or submandibular salivary gland masses. Respiratory: The patient is breathing comfortably.  ________________________________________________________________________A/P: This is Michelle Barron 57 y.o. female with history of GERD and a history of intermittent non-painful bilateral submandibular swelling last year with normal ultrasound of neck. She returns today with primary complaint of pruritus of bilateral EACs. #Intermittent bilateral neck swelling- Exam and imaging last year within normal limits. At this time, her symptoms are not significantly impacting QOL -- if worsening or persistent unilateral swelling that does not resolve or new concerning symptoms, can consider [place] imaging of the neck. #Eczema of outer ear- Hydrocortisone cream application to lateral EAC - RTC 3 months Electronically Signed by Sharlene Motts, MD, November 29, 2022

## 2022-12-01 ENCOUNTER — Telehealth: Admit: 2022-12-01 | Payer: PRIVATE HEALTH INSURANCE | Attending: Ophthalmology | Primary: Internal Medicine

## 2022-12-01 NOTE — Telephone Encounter
Call placed to pt, appt r/s. Pt stated that her vision has gotten worst since the doctor gave her a new medication. Pt would like to speak to provider and get seen sooner.

## 2022-12-03 NOTE — Telephone Encounter
Lvm for Michelle Barron to return call with more information.Last visit notes include AT's QID, PM ointment QHS and RestasisWill wait on call back

## 2023-02-25 ENCOUNTER — Encounter: Admit: 2023-02-25 | Payer: PRIVATE HEALTH INSURANCE | Attending: Urology | Primary: Internal Medicine

## 2023-02-25 ENCOUNTER — Ambulatory Visit: Admit: 2023-02-25 | Payer: MEDICAID | Attending: Urology | Primary: Internal Medicine

## 2023-02-25 DIAGNOSIS — N399 Disorder of urinary system, unspecified: Secondary | ICD-10-CM

## 2023-02-25 DIAGNOSIS — R3129 Other microscopic hematuria: Secondary | ICD-10-CM

## 2023-02-25 DIAGNOSIS — K219 Gastro-esophageal reflux disease without esophagitis: Secondary | ICD-10-CM

## 2023-02-25 MED ORDER — SOLIFENACIN 5 MG TABLET
5 mg | ORAL_TABLET | Freq: Every day | ORAL | 12 refills | Status: AC
Start: 2023-02-25 — End: ?

## 2023-02-25 NOTE — Unmapped
Follow-up Note Michelle Barron is a 57 y.o. female is here for follow up with established diagnosis microhematuria , RLQ painLast evaluated on 07/16/22  Previously followed by Urologist in [place].  Michelle Barron.  No records available. Pt reports 2 mos history LLQ pain.  Rates 5/10. She tries to avoid tylenol and advil.  Has taken when discomfort increasingly bothersome.  No gross hematuria, no fever.  Occasional chill.Reviewed previous work upUrine cytology: negativeCT nephrogram: no stones no hydro. Mild urinary bladder wall thickening is nonspecificCT on : findings are equivocal for tiny 1 mm renal calculi bilaterally (favor that this is artifact) no hydronephrosis 01/17/23 [place] : no evidence of acute intra abdominal pathology.  Probable transient hepatic attenuation differences heterogeneous liver enhancement.  A potentential hyperenhancement subcentimeter lesion posteriorly in R hepatic lobe could reflect an underlying hemangioma.  This could be characterized with outpatient liver protocol MRICystoscopy with Michelle Barron : showed a few glomerulations. No evidence of tumor or stones. Normal expansion. Retroflex exam normal2/13/22 [place]: focal hyperenhancement in R liver lobe peripherally measuring up to 1.2 cm /recommend MRIMRI abdomen consistent with hepatic steatosis and liver hemangiomaHPI  The patient, with a history of microscopic hematuria and right lower quadrant pain, presents with persistent symptoms. She reports a similar pain in her back, which now radiates down her leg, suggestive of sciatica. She also reports increased urinary frequency, particularly at night, with up to four episodes before midnight. She describes urgency, with leakage if she does not reach the bathroom in time, and occasional dysuria. She denies visible hematuria. Her fluid intake consists mainly of water and one cup of coffee daily.   Michelle Barron  reports that she has never smoked. She has never used smokeless tobacco. She reports that she does not drink alcohol. No history on file for drug use.  Past Medical History: Diagnosis Date  GERD (gastroesophageal reflux disease)  Past Surgical History: Procedure Laterality Date  APPENDECTOMY  20 yrs No Known AllergiesReview Of SystemsReview of SystemsGU: See HPIPhysical ExamHt 5' 1 (1.549 m)  - Wt 72.1 kg  - BMI 30.04 kg/m? Lab Results Component Value Date  UCOLOR Yellow 02/25/2023  UGLUCOSE Negative 02/25/2023  UKETONE Negative 02/25/2023  USPECGRAVITY 1.030 02/25/2023  UPROTEIN Negative 02/25/2023  UNITRATES Negative 02/25/2023  UBLOOD Positive 02/25/2023  ULEUKOCYTES 2+ 02/25/2023    No results found for: PVRPOCTPhysical Exam      Physical Exam Constitutional: oriented to person, place, and time.  appears well-developed and well-nourished. HENT: Head: Normocephalic. Eyes: No scleral icterus. Cardiovascular: Normal rate.  Pulmonary/Chest: Effort normal. Abdominal: Soft. Musculoskeletal: Normal range of motion. Lymphadenopathy:    no cervical adenopathy. Neurological: alert and oriented to person, place, and time. Skin: Skin is warm and dry. Psychiatric: Has a normal mood and affect. Her behavior is normal. Judgment and thought content normal.  Assessment & Plan:Michelle Barron is a 57 y.o. female   Overactive Bladder: Frequent nocturia and urgency with occasional incontinence. No UTI on urinalysis.-Start VESIcare 5mg  at bedtime.-Stop fluid intake 2 hours before bedtime.-Elevate legs for 2 hours before bedtime.-Follow up in 3 months to assess response to treatment.Lower Back Pain: Pain radiating down the leg suggestive of sciatica. No renal pathology on recent [place] scan.-Recommend discussion with primary care provider for further evaluation and management.Microscopic Hematuria: Negative cytology and [place] scan. No visible hematuria reported.-Continue monitoring.   Encounter Diagnoses Name SNOMED [place](R) Primary?  Disease of urinary tract DISORDER OF URINARY TRACT Yes Michelle Barron, APRNI provided a concise overview of  the ambient note generation solution. Michelle Barron or their legally authorized representative verbally consented to a temporary audio recording of their visit to assist with the completion of the visit documentation. This note was completed using an AI-powered solution and reviewed by Michelle Sheen, APRN for accuracy.

## 2023-02-26 ENCOUNTER — Encounter: Admit: 2023-02-26 | Payer: PRIVATE HEALTH INSURANCE | Attending: Internal Medicine | Primary: Internal Medicine

## 2023-02-26 DIAGNOSIS — R29898 Other symptoms and signs involving the musculoskeletal system: Secondary | ICD-10-CM

## 2023-04-09 ENCOUNTER — Encounter: Admit: 2023-04-09 | Payer: MEDICAID | Attending: Neuromuscular Medicine | Primary: Internal Medicine

## 2023-04-09 DIAGNOSIS — G709 Myoneural disorder, unspecified: Secondary | ICD-10-CM

## 2023-04-09 DIAGNOSIS — R29898 Other symptoms and signs involving the musculoskeletal system: Secondary | ICD-10-CM

## 2023-04-09 NOTE — Procedures
EMG NCS completed. Normal study.

## 2023-04-16 ENCOUNTER — Ambulatory Visit: Admit: 2023-04-16 | Payer: MEDICAID | Attending: Otolaryngology | Primary: Internal Medicine

## 2023-04-16 ENCOUNTER — Encounter: Admit: 2023-04-16 | Payer: PRIVATE HEALTH INSURANCE | Attending: Otolaryngology | Primary: Internal Medicine

## 2023-04-16 DIAGNOSIS — H9313 Tinnitus, bilateral: Secondary | ICD-10-CM

## 2023-04-16 DIAGNOSIS — K219 Gastro-esophageal reflux disease without esophagitis: Secondary | ICD-10-CM

## 2023-04-16 MED ORDER — FLUOCINOLONE ACETONIDE OIL 0.01 % EAR DROPS
0.01 | Freq: Two times a day (BID) | OTIC | 41 refills | Status: AC
Start: 2023-04-16 — End: ?

## 2023-04-16 NOTE — Patient Instructions
EczemaEczema is a broad term for a group of skin conditions that cause skin to become rough and inflamed. Each type of eczema has different triggers, symptoms, and treatments. Eczema of any type is usually itchy and symptoms range from mild to severe. Atopic dermatitis is a long-term (chronic) skin disease that keeps coming back (recurring). Usual symptoms are dry skin and small, solid pimples that may swell and leak fluid (weep). This is the most common form of Eczema involving the ear canals.Eczema and its symptoms are not spread from person to person (are not contagious). It can appear on different parts of the body at different times. Your eczema may not look the same as someone else's eczema.How is eczema diagnosed?Your health care provider will examine your skin and review your medical history. He or she may also give you skin patch tests. These tests involve taking patches that contain possible allergens and placing them on your back. He or she will then check in a few days to see if an allergic reaction occurred. Usually these are not required for isolated eczema in the ear canal. What are the common treatments?Treatment for eczema is based on the type of eczema you have. Hydrocortisone steroid medicine can relieve itching quickly and help reduce inflammation. This medicine may be prescribed or obtained over-the-counter, depending on the strength of the medicine that is needed.Follow these instructions at home:Take over-the-counter and prescription medicines only as told by your health care provider.Use creams or ointments to moisturize your skin. Do not use lotions.  I recommended using local steroid drops to keep the eczema at bay. I recommend to use to use Dermotic drops once a week and continuously for five days when having flare ups. It's important to refrain from chronic continuous use of the steroids due to the risk of skin atrophy. When feeling sense of fullness in the ears I recommend using an aural toilet. I recommended using 1/2 white vinegar, 1/2 rubbing alcohol, mix in small glass, and use a rubber ear bulb syringe to gently flush out the ear.  Let run out then dry ear with hairdryer on low cool settingLearn what triggers or irritates your symptoms. Avoid these things.Treat symptom flare-ups quickly.Do not itch your skin. Do not use Q-tips. This can make your rash worse.Keep all follow-up visits as told by your health care provider. This is important.Where to find more informationThe American Academy of Dermatology: www.aad.orgThe National Eczema Association: www.nationaleczema.orgContact a health care provider if:You have serious itching, even with treatment.You regularly scratch your skin until it bleeds.Your rash looks different than usual.Your skin is painful, swollen, or more red than usual.You have a fever.SummaryThere are eight general types of eczema. Each type has different triggers.Eczema of any type causes itching that may range from mild to severe.Treatment varies based on the type of eczema you have. Hydrocortisone steroid medicine can help with itching and inflammation.Protecting your skin is the best way to prevent eczema. Use moisturizers and lotions. Avoid triggers and irritants, and treat flare-ups quickly.This information is not intended to replace advice given to you by your health care provider. Make sure you discuss any questions you have with your health care provider.

## 2023-04-16 NOTE — Progress Notes
[place] [facility] ClinicThis is TANEEKA HARDEMAN a 57 y.o. female presenting to the ENT clinic for follow-up evaluation of aural pruritus. At her last visit on 11/29/2022, she reports pruritus of her EACs and occasional aural fullness. She was prescribed a cortisone cream for her external ears, which she uses every day. She reports that the cream has not helped significantly because the pruritus is deeper. She has also started to experience bilateral tinnitus for the past few months, which is described as a constant, non-pulsatile buzz. She denies aural fullness, otorrhea, otalgia, or hearing loss. The patient?s complete system review, past medical, family, and social history have been reviewed and are noted on the patient questionnaire, with no change.  Prior to Admission medications  Medication Sig Start Date End Date Taking? Authorizing Provider busPIRone (BUSPAR) 5 mg tablet TAKE 1 TABLET BY MOUTH 1-2 TIMES A DAY AS NEEDED 02/28/22  Yes Provider, Historical cycloSPORINE (RESTASIS MULTIDOSE) 0.05 % ophthalmic solution Apply 1 drop to eye 2 times daily (0900, 1700). 07/05/22  Yes Vora, Sande Brothers, MD solifenacin (VESICARE) 5 mg tablet Take 1 tablet (5 mg total) by mouth daily. 02/25/23 02/25/24 Yes Curto, Renae Fickle, APRN tenofovir disoproxil (VIREAD) 300 mg tablet TAKE 1 TABLET BY MOUTH EVERY DAY 09/05/19  Yes Provider, Historical chlorhexidine gluconate (PERIDEX) 0.12 % solution Swish 1 capful in mouth for 1 minute and spit out once a day in the evening and after brushing 02/28/21   Vickii Penna, DMD doxycycline hyclate (VIBRAMYCIN) 100 mg capsule Take 1 capsule (100 mg total) by mouth 2 (two) times daily. 04/27/22   Josefa Half., MD fluticasone propionate (FLONASE) 50 mcg/actuation nasal spray Use 2 sprays in each nostril daily. 11/07/21   Reginal Lutes, MD hydrocortisone (HYTONE) 2.5 % cream Apply topically daily.Patient not taking: Reported on 04/16/2023 11/29/22   Sharlene Motts, MD polyethylene glycol (MIRALAX) 17 gram packet Take 1 packet (17 g total) by mouth daily. Mix in 8 ounces of water, juice, soda, coffee or tea prior to taking.Patient not taking: Reported on 04/16/2023 10/09/20   Viona Gilmore, MD terconazole (TERAZOL 3) 80 mg vaginal suppository I suppository  hs x 3Patient not taking: Reported on 04/09/2023 12/22/21   Josefa Half., MD BP 134/87  - Pulse 82  - Temp 97 ?F (36.1 ?C)  - SpO2 100% ________________________________________________________________________Physical Exam:Constitutional: The patient was well-developed and well-nourished and in no apparent distress. The patient communicated by phonation with a clear voice.  Head and Face: There were no cutaneous lesions of the head or face. Eyes: Extraocular movements were intact with no nystagmus.   Ears: The auricles were normal bilaterally. The external auditory canals were clear with minor erythema and the tympanic membranes were flat without any evidence of retraction or effusion.  Nose: The external nose appeared normal.Neurologic: Cranial nerves were intact grossly bilaterally. Neck/Lymphatic: There were no palpable lymph nodes or masses in the lateral neck or central compartment.Respiratory: The patient is breathing comfortably.  ________________________________________________________________________Appropriate imaging has been reviewed and the following is noted:A/P: This is Leafy Half 57 y.o. female with aural pruritus, likely with eczematous changes of her EAC. Plan for medical management with close follow-up. - We will arrange an audiogram to evaluate her bilateral tinnitus- Plan for dermotic drops for 5 days for an acute flare up, and weekly for maintenance therapy- Plan for weekly aural flushes- We will follow-up with her in 1 month to re-evaluate her symptoms- Patient counseled appropriate management  regarding their disease management.- Patient to return to care if any increase or change in symptoms are noted.Electronically Signed by Everardo Beals, MD, April 16, 2023

## 2023-04-23 ENCOUNTER — Telehealth: Admit: 2023-04-23 | Payer: PRIVATE HEALTH INSURANCE | Attending: Obstetrics and Gynecology | Primary: Internal Medicine

## 2023-04-24 ENCOUNTER — Ambulatory Visit: Admit: 2023-04-24 | Payer: MEDICAID | Attending: Obstetrics and Gynecology | Primary: Internal Medicine

## 2023-04-24 ENCOUNTER — Encounter: Admit: 2023-04-24 | Payer: PRIVATE HEALTH INSURANCE | Attending: Obstetrics and Gynecology | Primary: Internal Medicine

## 2023-04-24 ENCOUNTER — Encounter: Admit: 2023-04-24 | Payer: MEDICAID | Attending: Obstetrics and Gynecology | Primary: Internal Medicine

## 2023-04-24 DIAGNOSIS — N898 Other specified noninflammatory disorders of vagina: Secondary | ICD-10-CM

## 2023-04-24 DIAGNOSIS — F32A Depression: Secondary | ICD-10-CM

## 2023-04-24 DIAGNOSIS — Z01419 Encounter for gynecological examination (general) (routine) without abnormal findings: Secondary | ICD-10-CM

## 2023-04-24 DIAGNOSIS — H9319 Tinnitus, unspecified ear: Secondary | ICD-10-CM

## 2023-04-24 DIAGNOSIS — F419 Anxiety disorder, unspecified: Secondary | ICD-10-CM

## 2023-04-24 DIAGNOSIS — K219 Gastro-esophageal reflux disease without esophagitis: Secondary | ICD-10-CM

## 2023-04-24 DIAGNOSIS — K760 Fatty (change of) liver, not elsewhere classified: Secondary | ICD-10-CM

## 2023-04-24 DIAGNOSIS — R519 Headache: Secondary | ICD-10-CM

## 2023-04-24 DIAGNOSIS — Z1231 Encounter for screening mammogram for malignant neoplasm of breast: Secondary | ICD-10-CM

## 2023-04-24 MED ORDER — MAGNESIUM OXIDE 420 MG TABLET
420 | Freq: Every day | ORAL | Status: AC
Start: 2023-04-24 — End: ?

## 2023-04-24 MED ORDER — CHOLECALCIFEROL (VITAMIN D3) 25 MCG (1,000 UNIT) TABLET
25 | Freq: Every day | ORAL | Status: AC
Start: 2023-04-24 — End: ?

## 2023-04-24 NOTE — Progress Notes
[facility] OF MEDICINE						Department of Obstetrics, Gynecology, Reproductive MedicineCenter for [facility]  Michelle Barron is a 57 y.o. G3P2 female who presents for annual exam, mammogram referral and infection check. She reports intermittent vaginal itching. She denies d/c, odor, urinary s/s. She has not taken any recent antibiotics. The patient has no other complaints today. Past Medical History: Diagnosis Date  Anxiety   Depression   Fatty liver   GERD (gastroesophageal reflux disease)   Headache   Tinnitus   with headaches Past Surgical History: Procedure Laterality Date  APPENDECTOMY  20 yrs Hysterectomy noOvaries intact:yesOB History   Gravida 3  Para 2  Term 1  Preterm    AB    Living 2   SAB    IAB    Ectopic    Molar    Multiple    Live Births 2    Menopause: yesHRT use: noSexually active: not currentlyHistory of STIs: noLast pap: 2021History of abnormal Pap smear:noLast mammogram: 2023Social History Socioeconomic History  Marital status: Divorced Tobacco Use  Smoking status: Never  Smokeless tobacco: Never Vaping Use  Vaping Use: Never used Substance and Sexual Activity  Alcohol use: No  Drug use: Never  Sexual activity: Not Currently   Comment: no partner currently Family History Problem Relation Age of Onset  Asthma Father  Patient Active Problem List Diagnosis  Microhematuria  Depression  Breast calcification, left  Submandibular swelling Current Outpatient Medications Medication Sig Dispense Refill  busPIRone (BUSPAR) 5 mg tablet TAKE 1 TABLET BY MOUTH 1-2 TIMES A DAY AS NEEDED    chlorhexidine gluconate (PERIDEX) 0.12 % solution Swish 1 capful in mouth for 1 minute and spit out once a day in the evening and after brushing 473 mL 2  cycloSPORINE (RESTASIS MULTIDOSE) 0.05 % ophthalmic solution Apply 1 drop to eye 2 times daily (0900, 1700). 5.5 mL 11  solifenacin (VESICARE) 5 mg tablet Take 1 tablet (5 mg total) by mouth daily. 30 tablet 11  tenofovir disoproxil (VIREAD) 300 mg tablet TAKE 1 TABLET BY MOUTH EVERY DAY    cholecalciferol, vitamin D3, 25 mcg (1,000 unit) tablet Take 1 tablet (1,000 Units total) by mouth daily.    hydrocortisone (HYTONE) 2.5 % cream Apply topically daily. (Patient not taking: Reported on 04/16/2023) 30 g 2  magnesium oxide 420 mg Tab Take 400 mg by mouth daily.   No current facility-administered medications for this visit.  No Known AllergiesReview of SystemsGeneral: Denies unintentional weight loss or gain. HEENT: Denies changes in vision and hearing.Respiratory: Denies SOB and cough.?Cardio:  Denies palpitations and chest pain. ?GI: Denies abdominal pain, nausea, vomiting and diarrhea.?GU: Denies dysuria and urinary frequency.?Musculo: Denies myalgia and arthralgia.?Skin: Denies rash and pruritus.Neuro: Denies headache and syncope.?Psych: Denies recent changes in mood. Denies anxiety and depression. Physical Exam:BP 132/85  - Pulse 78  - Temp 98.4 ?F (36.9 ?C) (No-touch scanner)  - Resp 18  - Ht 5' 1 (1.549 m)  - Wt 74.3 kg  - SpO2 100%  - BMI 30.95 kg/m? Body mass index is 30.95 kg/m?Marland KitchenGeneral: calm, cooperate, in NADThyroid: no thyromegaly or nodules noted Heart: RRR without murmurLungs: CTABBreast: soft, normal appearance, symmetrical, non-tender, no masses, no skin changes and no lymphadenopathy bilaterally Abdomen: soft, non-tender, no masses Pelvic: normal female genitalia, no lesions or erythemaVagina: speculum exam deferredCervix: speculum exam deferred, negative CMT,  pap not due, vaginal swab collected Uterus: firm, NT, normal size, shape and consistency Adnexa: firm, NT, no masses or enlargement noted Rectal: no masses  or bleeding This exam was completed by Roselyn Bering, CNM and chaperoned by the following: Chaperone Name, Role/Title: Deniece Portela, ACAAssessment: 1. Well woman exam with routine gynecological examRoutine annual gyn exam without pap.2. Screening mammogram, encounter forBilateral screening mammogram ordered. Pt to schedule appt.- Mammography Screening Tomo Bilateral; Future3. Vaginal itchingVaginal swab collected and sent to lab.Advised to keep the area cool and dry, wash with warm water only.- Vaginosis & Vaginitis Panel by NAAT (BH GH LMW Y[facility]); Future Plan: Reviewed ASCCP guidelines with paps recommended until age 24. Recommend annual mammogram Recommend colonoscopy q 10 years starting at age 66 unless high risk. She was encouraged to discuss with her PCP. Recommend routine follow-up with PCP Breast self awareness reviewedRecommend healthy diet and regular weight bearing exerciseReturn to care for annual in one year or as needed. Roselyn Bering, CNM

## 2023-04-25 ENCOUNTER — Encounter: Admit: 2023-04-25 | Payer: PRIVATE HEALTH INSURANCE | Attending: Obstetrics and Gynecology | Primary: Internal Medicine

## 2023-04-25 DIAGNOSIS — B3731 Yeast vaginitis: Secondary | ICD-10-CM

## 2023-04-25 LAB — BACTERIAL VAGINOSIS BY NAAT: BKR BACTERIAL VAGINOSIS NAAT: NEGATIVE

## 2023-04-25 LAB — CANDIDA & TRICHOMONAS VAGINITIS BY NAAT (BH GH LMW YH)
BKR CANDIDA GLABRATA NAAT: NEGATIVE
BKR CANDIDA SPECIES GROUP NAAT: POSITIVE — AB
BKR TRICHOMONAS VAGINALIS NAAT: NEGATIVE

## 2023-04-25 MED ORDER — FLUCONAZOLE 150 MG TABLET
150 | ORAL_TABLET | ORAL | 1 refills | Status: AC
Start: 2023-04-25 — End: ?

## 2023-04-25 NOTE — Other
Informed patient of yeast and diflucan

## 2023-05-14 ENCOUNTER — Ambulatory Visit: Admit: 2023-05-14 | Payer: MEDICAID | Attending: Otolaryngology | Primary: Internal Medicine

## 2023-05-30 ENCOUNTER — Encounter: Admit: 2023-05-30 | Payer: PRIVATE HEALTH INSURANCE | Primary: Internal Medicine

## 2023-05-30 ENCOUNTER — Ambulatory Visit: Admit: 2023-05-30 | Payer: MEDICAID | Primary: Internal Medicine

## 2023-05-30 DIAGNOSIS — H60543 Acute eczematoid otitis externa, bilateral: Secondary | ICD-10-CM

## 2023-05-30 DIAGNOSIS — K760 Fatty (change of) liver, not elsewhere classified: Secondary | ICD-10-CM

## 2023-05-30 DIAGNOSIS — K219 Gastro-esophageal reflux disease without esophagitis: Secondary | ICD-10-CM

## 2023-05-30 DIAGNOSIS — F32A Depression: Secondary | ICD-10-CM

## 2023-05-30 DIAGNOSIS — H9319 Tinnitus, unspecified ear: Secondary | ICD-10-CM

## 2023-05-30 DIAGNOSIS — F419 Anxiety disorder, unspecified: Secondary | ICD-10-CM

## 2023-05-30 DIAGNOSIS — R519 Headache: Secondary | ICD-10-CM

## 2023-05-30 NOTE — Progress Notes
 Michelle -  [facility] ClinicThis is Michelle Barron a 57 y.o. female presenting to the ENT [facility] for follow-up evaluation of aural pruritus. She was last seen by ENT on 8/20 at which time she endorsed intermittent pruritus of bilateral EACs, occasional sensation of aural fullness, and bilateral tinnitus described as non-pulsatile, buzzing noise. She was referred to audiology but has not yet made an appointment. She was also given a prescription for dermotic drops. Today she returns for follow up. She reports itching has improved since starting the dermotic drops. She continues to endorse bilateral tinnitus, worse at night and non-pulsatile/ buzzing in nature. Denies hearing loss, vertigo, history of ear infections, otalgia, otorrhea.  The patient?s complete system review, past medical, family, and social history have been reviewed and are noted on the patient questionnaire, with no change.  Prior to Admission medications  Medication Sig Start Date End Date Taking? Authorizing Provider busPIRone (BUSPAR) 5 mg tablet TAKE 1 TABLET BY MOUTH 1-2 TIMES A DAY AS NEEDED 02/28/22   Provider, Historical chlorhexidine gluconate (PERIDEX) 0.12 % solution Swish 1 capful in mouth for 1 minute and spit out once a day in the evening and after brushing 02/28/21   Vickii Penna, DMD cholecalciferol, vitamin D3, 25 mcg (1,000 unit) tablet Take 1 tablet (1,000 Units total) by mouth daily.    Provider, Historical cycloSPORINE (RESTASIS MULTIDOSE) 0.05 % ophthalmic solution Apply 1 drop to eye 2 times daily (0900, 1700). 07/05/22   Randie Heinz, MD fluconazole (DIFLUCAN) 150 mg tablet Take 1 tablet (150 mg total) by mouth every 3 (three) days. 04/25/23   Cassells, Noreene Larsson, CNM hydrocortisone (HYTONE) 2.5 % cream Apply topically daily.Patient not taking: Reported on 04/16/2023 11/29/22   Sharlene Motts, MD magnesium oxide 420 mg Tab Take 400 mg by mouth daily.    Provider, Historical solifenacin (VESICARE) 5 mg tablet Take 1 tablet (5 mg total) by mouth daily. 02/25/23 02/25/24  Curto, Renae Fickle, APRN tenofovir disoproxil (VIREAD) 300 mg tablet TAKE 1 TABLET BY MOUTH EVERY DAY 09/05/19   Provider, Historical BP 122/84  - Pulse 74  - Temp 97.8 ?F (36.6 ?C) (Temporal)  - Resp 18  - Ht 5' 1 (1.549 m)  - Wt 74.8 kg  - SpO2 100% Comment: RA - BMI 31.18 kg/m? ________________________________________________________________________Physical Exam:Constitutional: The patient was well-developed and well-nourished and in no apparent distress. The patient communicated by phonation with a clear voice.  Head and Face: There were no cutaneous lesions of the head or face. Eyes: Extraocular movements were intact with no nystagmus.   Ears: The auricles were normal bilaterally. The external auditory canals were clear, right > left scaly, dry appearing skin and the tympanic membranes were flat without any evidence of retraction or effusion.  Nose: The external nose appeared normal. Anterior rhinoscopy demonstrated a midline nasal septum with normal turbinates.  Oral Cavity/Oropharynx: The oral cavity showed no mucosal abnormalities. Neurologic: Cranial nerves were intact grossly bilaterally. Neck/Lymphatic: There were no palpable lymph nodes or masses in the lateral neck or central compartment. Respiratory: The patient is breathing comfortably.  ________________________________________________________________________Appropriate imaging has been reviewed and the following is noted:A/P: This is Michelle Barron 57 y.o. female with bilateral eczematous changes of her EACs with associated pruritus improved with dermotic drops. We discussed that dermotic should be used sparingly for flare ups. If she wishes to use something more frequently, okay to use small amount of mineral oil applied gently to meatus with q-tip. Phone  number for audiology provided to schedule audiogram. - Dermotic drops for flare ups, no more than 3-5 days of consecutive use- Mineral oil over the counter as needed- Patient will schedule hearing test- RTC 2-3 months after audioElectronically Signed by Karn Pickler, MD, May 30, 2023

## 2023-06-03 ENCOUNTER — Encounter: Admit: 2023-06-03 | Payer: PRIVATE HEALTH INSURANCE | Primary: Internal Medicine

## 2023-06-03 ENCOUNTER — Ambulatory Visit: Admit: 2023-06-03 | Payer: MEDICAID | Primary: Internal Medicine

## 2023-06-03 ENCOUNTER — Encounter: Admit: 2023-06-03 | Payer: PRIVATE HEALTH INSURANCE | Attending: Urology | Primary: Internal Medicine

## 2023-06-03 ENCOUNTER — Ambulatory Visit: Admit: 2023-06-03 | Payer: MEDICAID | Attending: Urology | Primary: Internal Medicine

## 2023-06-03 DIAGNOSIS — K760 Fatty (change of) liver, not elsewhere classified: Secondary | ICD-10-CM

## 2023-06-03 DIAGNOSIS — K219 Gastro-esophageal reflux disease without esophagitis: Secondary | ICD-10-CM

## 2023-06-03 DIAGNOSIS — F419 Anxiety disorder, unspecified: Secondary | ICD-10-CM

## 2023-06-03 DIAGNOSIS — Z0000 Encounter for general adult medical examination without abnormal findings: Secondary | ICD-10-CM

## 2023-06-03 DIAGNOSIS — H9319 Tinnitus, unspecified ear: Secondary | ICD-10-CM

## 2023-06-03 DIAGNOSIS — R519 Headache: Secondary | ICD-10-CM

## 2023-06-03 DIAGNOSIS — R3129 Other microscopic hematuria: Secondary | ICD-10-CM

## 2023-06-03 DIAGNOSIS — F32A Depression: Secondary | ICD-10-CM

## 2023-06-03 DIAGNOSIS — N399 Disorder of urinary system, unspecified: Secondary | ICD-10-CM

## 2023-06-03 MED ORDER — FESOTERODINE ER 8 MG TABLET,EXTENDED RELEASE 24 HR
8 | ORAL_TABLET | Freq: Every day | ORAL | 12 refills | Status: AC
Start: 2023-06-03 — End: ?

## 2023-06-03 NOTE — Progress Notes
 Follow-up Note Michelle Barron is a 57 y.o. female is here for follow up with established diagnosis microhematuria , RLQ painLast evaluated on 07/16/22  Previously followed by Urologist in [place].  Dr Moise Boring.  No records available. Pt reports 2 mos history LLQ pain.  Rates 5/10. She tries to avoid tylenol and advil.  Has taken when discomfort increasingly bothersome.  No gross hematuria, no fever.  Occasional chill.Reviewed previous work upUrine cytology: negativeCT nephrogram: no stones no hydro. Mild urinary bladder wall thickening is nonspecificCT on : findings are equivocal for tiny 1 mm renal calculi bilaterally (favor that this is artifact) no hydronephrosis 01/17/23 [place] : no evidence of acute intra abdominal pathology.  Probable transient hepatic attenuation differences heterogeneous liver enhancement.  A potentential hyperenhancement subcentimeter lesion posteriorly in R hepatic lobe could reflect an underlying hemangioma.  This could be characterized with outpatient liver protocol MRICystoscopy with Dr Mariana Single : showed a few glomerulations. No evidence of tumor or stones. Normal expansion. Retroflex exam normal2/13/22 [place]: focal hyperenhancement in R liver lobe peripherally measuring up to 1.2 cm /recommend MRIMRI abdomen consistent with hepatic steatosis and liver hemangiomaHPI  The patient, with a history of OAB and microhematuria presents with ongoing urinary frequency despite being on solifenacin (VESIcare) She urinates frequently, more than eight times a day, and also has nocturia, although this has improved slightly due to limiting fluid intake before bed. She denies incontinence. In addition to the urinary symptoms, the patient also reports chronic pain, the nature and location of which is not specified in the conversation. She also mentions a recent [place] scan that showed a probable hepatic hemangioma.   Michelle Barron  reports that she has never smoked. She has never used smokeless tobacco. She reports that she does not drink alcohol and does not use drugs.  Past Medical History: Diagnosis Date  Anxiety   Depression   Fatty liver   GERD (gastroesophageal reflux disease)   Headache   Tinnitus   with headaches Past Surgical History: Procedure Laterality Date  APPENDECTOMY  20 yrs No Known AllergiesReview Of SystemsReview of SystemsGU: See HPIPhysical ExamHt 5' 1 (1.549 m)  - Wt 73.5 kg  - BMI 30.61 kg/m? Lab Results Component Value Date  UCOLOR Yellow 06/03/2023  UGLUCOSE Negative 06/03/2023  UKETONE Negative 06/03/2023  USPECGRAVITY 1.025 06/03/2023  UPROTEIN Trace 06/03/2023  UNITRATES Negative 06/03/2023  UBLOOD Positive 06/03/2023  ULEUKOCYTES Negative 06/03/2023    No results found for: PVRPOCTPhysical Exam  GENITOURINARY: Catheterized specimen obtained with over 400 CCs of urine. Dipstick showed 4+ blood. Urine sent for cytology.   Physical Exam Constitutional: oriented to person, place, and time.  appears well-developed and well-nourished. HENT: Head: Normocephalic. Eyes: No scleral icterus. Cardiovascular: Normal rate.  Pulmonary/Chest: Effort normal. Abdominal: Soft. Musculoskeletal: Normal range of motion. Lymphadenopathy:    no cervical adenopathy. Neurological: alert and oriented to person, place, and time. Skin: Skin is warm and dry. Psychiatric: Has a normal mood and affect. Her behavior is normal. Judgment and thought content normal.  Assessment & Plan:Michelle Barron is a 57 y.o. female   Urinary Frequency and UrgencyNo improvement with VESIcare (solifenacin) since July. Patient reports needing to push to void and frequent urination. No incontinence reported.-Discontinue VESIcare.-Start Toviaz (fesoterodine), prescription sent to pharmacy.-Return in 6 months for follow-up, or sooner if cytology results indicate need.HematuriaPatient reports blood in urine. Catheterized specimen obtained today showed 4+ blood.-Send urine for cytology.-Call patient with results when available (expected in 10 days).Liver HemangiomaNoted on  abdomen/pelvis from  May 2024. Beyond current specialty's expertise.-Notify primary care physician (Dr. Edwin Dada) for further evaluation and management.   Encounter Diagnoses Name SNOMED [place](R) Primary?  Disease of urinary tract DISORDER OF URINARY TRACT Yes  Microhematuria MICROSCOPIC HEMATURIA  Nilsa Macht L Rimas Gilham, APRNI provided a concise overview of the ambient note generation solution. Leafy Half or their legally authorized representative verbally consented to a temporary audio recording of their visit to assist with the completion of the visit documentation. This note was completed using an AI-powered solution and reviewed by [name], APRN for accuracy.

## 2023-06-14 ENCOUNTER — Telehealth: Admit: 2023-06-14 | Payer: PRIVATE HEALTH INSURANCE | Attending: Obstetrics and Gynecology | Primary: Internal Medicine

## 2023-06-14 ENCOUNTER — Inpatient Hospital Stay: Admit: 2023-06-14 | Discharge: 2023-06-14 | Payer: MEDICAID | Primary: Internal Medicine

## 2023-06-14 ENCOUNTER — Encounter: Admit: 2023-06-14 | Payer: PRIVATE HEALTH INSURANCE | Attending: Obstetrics and Gynecology | Primary: Internal Medicine

## 2023-06-14 DIAGNOSIS — N760 Acute vaginitis: Secondary | ICD-10-CM

## 2023-06-14 NOTE — Telephone Encounter
 [facility] MESSAGETime of call:   10:33 AMCaller:   Kissy Reason for call:   the noted patient is calling stating she was seen on 8/28 and prescribed medicine for an infection. Patient is saying she is still very uncomfortable and still has pain and itching. She's asking to have testing called in or receive a phone call to discuss another treatment ?If not feeling well, what are symptoms:  pain, itching    If having symptoms, how long have the symptoms been present:  n/a   Does caller request to speak to someone urgently?  yes   If yes, warm transferred to:  n/aProvider:  Certified Nurse Midwife (CNM) Georgana Curio clinic visit date:  09/28/2024Best telephone number for callback:   [phone]  Best time to return call (encourage patient to be available for callback):   ASAP Permission to leave message:  yes   [facility]

## 2023-06-14 NOTE — Telephone Encounter
 Spoke to pt , reports vaginal itching , treated in August for pos yeast , pt informed a self swab was ordered to a [facility] lab .

## 2023-06-15 LAB — CANDIDA & TRICHOMONAS VAGINITIS BY NAAT (BH GH LMW YH)
BKR CANDIDA GLABRATA NAAT: NEGATIVE
BKR CANDIDA SPECIES GROUP NAAT: NEGATIVE
BKR TRICHOMONAS VAGINALIS NAAT: NEGATIVE

## 2023-06-15 LAB — BACTERIAL VAGINOSIS BY NAAT: BKR BACTERIAL VAGINOSIS NAAT: NEGATIVE

## 2023-06-17 ENCOUNTER — Emergency Department: Admit: 2023-06-17 | Payer: MEDICAID | Attending: Diagnostic Radiology | Primary: Internal Medicine

## 2023-06-17 ENCOUNTER — Inpatient Hospital Stay: Admit: 2023-06-17 | Discharge: 2023-06-17 | Payer: MEDICAID | Attending: Emergency Medicine

## 2023-06-17 DIAGNOSIS — R52 Pain, unspecified: Principal | ICD-10-CM

## 2023-06-17 LAB — BASIC METABOLIC PANEL
BKR ANION GAP: 10 (ref 7–17)
BKR BLOOD UREA NITROGEN: 10 mg/dL (ref 6–20)
BKR BUN / CREAT RATIO: 11.2 (ref 8.0–23.0)
BKR CALCIUM: 9.7 mg/dL (ref 8.8–10.2)
BKR CHLORIDE: 105 mmol/L (ref 98–107)
BKR CO2: 25 mmol/L (ref 20–30)
BKR CREATININE: 0.89 mg/dL (ref 0.40–1.30)
BKR EGFR, CREATININE (CKD-EPI 2021): >60 mL/min/{1.73_m2} (ref >=60)
BKR GLUCOSE: 88 mg/dL (ref 70–100)
BKR POTASSIUM: 4.6 mmol/L (ref 3.3–5.3)
BKR SODIUM: 140 mmol/L (ref 136–144)

## 2023-06-17 LAB — HEPATIC FUNCTION PANEL
BKR A/G RATIO: 1.5 (ref 1.0–2.2)
BKR ALANINE AMINOTRANSFERASE (ALT): 23 U/L (ref 10–35)
BKR ALBUMIN: 4.6 g/dL (ref 3.6–5.1)
BKR ALKALINE PHOSPHATASE: 64 U/L (ref 9–122)
BKR ASPARTATE AMINOTRANSFERASE (AST): 29 U/L (ref 10–35)
BKR AST/ALT RATIO: 1.3
BKR BILIRUBIN DIRECT: <0.2 mg/dL (ref <=0.3)
BKR BILIRUBIN TOTAL: 0.4 mg/dL (ref <=1.2)
BKR GLOBULIN: 3 g/dL (ref 2.0–3.9)
BKR PROTEIN TOTAL: 7.6 g/dL (ref 5.9–8.3)

## 2023-06-17 LAB — CBC WITH AUTO DIFFERENTIAL
BKR WAM ABSOLUTE IMMATURE GRANULOCYTES.: 0.02 x 1000/ÂµL (ref 0.00–0.30)
BKR WAM ABSOLUTE LYMPHOCYTE COUNT.: 4.16 x 1000/ÂµL — ABNORMAL HIGH (ref 0.60–3.70)
BKR WAM ABSOLUTE NRBC (2 DEC): 0 x 1000/ÂµL (ref 0.00–1.00)
BKR WAM ANC (ABSOLUTE NEUTROPHIL COUNT): 4.26 x 1000/ÂµL (ref 2.00–7.60)
BKR WAM BASOPHIL ABSOLUTE COUNT.: 0.03 x 1000/ÂµL (ref 0.00–1.00)
BKR WAM BASOPHILS: 0.3 % (ref 0.0–1.4)
BKR WAM EOSINOPHIL ABSOLUTE COUNT.: 0.05 x 1000/ÂµL (ref 0.00–1.00)
BKR WAM EOSINOPHILS: 0.5 % (ref 0.0–5.0)
BKR WAM HEMATOCRIT (2 DEC): 42.5 % (ref 35.00–45.00)
BKR WAM HEMOGLOBIN: 13.5 g/dL (ref 11.7–15.5)
BKR WAM IMMATURE GRANULOCYTES: 0.2 % (ref 0.0–1.0)
BKR WAM LYMPHOCYTES: 45 % (ref 17.0–50.0)
BKR WAM MCH (PG): 28.5 pg (ref 27.0–33.0)
BKR WAM MCHC: 31.8 g/dL (ref 31.0–36.0)
BKR WAM MCV: 89.9 fL (ref 80.0–100.0)
BKR WAM MONOCYTE ABSOLUTE COUNT.: 0.72 x 1000/ÂµL (ref 0.00–1.00)
BKR WAM MONOCYTES: 7.8 % (ref 4.0–12.0)
BKR WAM MPV: 9.7 fL (ref 8.0–12.0)
BKR WAM NEUTROPHILS: 46.2 % (ref 39.0–72.0)
BKR WAM NUCLEATED RED BLOOD CELLS: 0 % (ref 0.0–1.0)
BKR WAM PLATELETS: 333 x1000/ÂµL (ref 150–420)
BKR WAM RDW-CV: 13.2 % (ref 11.0–15.0)
BKR WAM RED BLOOD CELL COUNT.: 4.73 M/ÂµL (ref 4.00–6.00)
BKR WAM WHITE BLOOD CELL COUNT: 9.2 x1000/ÂµL (ref 4.0–11.0)

## 2023-06-17 LAB — URINALYSIS-MACROSCOPIC W/REFLEX MICROSCOPIC
BKR BILIRUBIN, UA: NEGATIVE
BKR GLUCOSE, UA: NEGATIVE
BKR KETONES, UA: NEGATIVE
BKR LEUKOCYTE ESTERASE, UA: NEGATIVE
BKR NITRITE, UA: NEGATIVE
BKR PH, UA: 6 (ref 5.5–7.5)
BKR PROTEIN, UA: NEGATIVE
BKR SPECIFIC GRAVITY, UA: 1.005 (ref 1.005–1.030)
BKR UROBILINOGEN, UA (MG/DL): <2 mg/dL (ref <=2.0)

## 2023-06-17 LAB — URINE MICROSCOPIC     (BH GH LMW YH)
BKR RBC/HPF INSTRUMENT: 1 /HPF (ref 0–2)
BKR WBC/HPF INSTRUMENT: 1 /HPF (ref 0–5)

## 2023-06-17 MED ORDER — KETOROLAC 60 MG/2 ML INTRAMUSCULAR SOLUTION
60 | Freq: Once | INTRAMUSCULAR | Status: CP
Start: 2023-06-17 — End: ?
  Administered 2023-06-18: 60 mL via INTRAMUSCULAR

## 2023-06-17 MED ORDER — KETOROLAC 60 MG/2 ML INTRAMUSCULAR SOLUTION
602 mg/2 mL | Freq: Once | INTRAVENOUS | Status: DC
Start: 2023-06-17 — End: 2023-06-17

## 2023-06-17 NOTE — ED Notes
 4:04 PM Pt presents to AED from walkin c/o generalized pain. Denies sick contacts, denies f/c/n/v/cp/sob. Denies recent trauma. Pt unable to localize specific area of pain. Pending provider eval.

## 2023-06-17 NOTE — Other
 Results are negative. There is not active vaginal infection.

## 2023-06-17 NOTE — Other
 Spoke to patient with results. She mentioned she is still having lower back pain and thinks she may have a kidney stone. She plans on seeing a urologist to further investigate her pain.

## 2023-06-17 NOTE — ED Notes
 Pt report received from RN Regan.6:45 PM Pt sleeping in room, in NAD.7:21 PM Pt report given to [employer].Lucianne Muss MSN, MBA, RN, CEN, TCRNPlease excuse any grammatical errors as portions of this note may have been created with a dictation device.

## 2023-06-18 DIAGNOSIS — M545 Low back pain, unspecified: Secondary | ICD-10-CM

## 2023-06-18 DIAGNOSIS — R350 Frequency of micturition: Secondary | ICD-10-CM

## 2023-06-18 DIAGNOSIS — R519 Headache, unspecified: Secondary | ICD-10-CM

## 2023-06-18 DIAGNOSIS — R1030 Lower abdominal pain, unspecified: Secondary | ICD-10-CM

## 2023-06-18 LAB — URINE CULTURE

## 2023-06-18 NOTE — Discharge Instructions
 Thank you for entrusting [place] with your medical care. It was our pleasure meeting and taking care of you. You were seen in the ED for your belly pain and back pain. Your labs were reassuring. Based on the cat scan you had in May, you may need a liver MRI. You can follow-up with your PCP regarding this. Please return to the nearest emergency department or call 911 if you develop new or worsening symptoms such as: fevers, chills, trouble breathing, chest pain, abdominal pain, inability to keep down foods or fluids, or if you have any other symptoms that concern you. Please follow-up with your primary care doctor as soon as possible. If you need a primary care provider please try one of the following options: -In [facility]: [facility] [phone]), [facility] [phone]), or [facility] [phone]).-In [place]: [phone] can call 211 for essential community resources if needed. It was our pleasure meeting and taking care of you! Please let us know if there are any additional resources we can provide you today!

## 2023-06-18 NOTE — ED Notes
 7:15 PM Report received, this RN assumes care. Pt resting, NAD noted at this time, respirations even and regular. Pending provider eval. Chief Complaint Patient presents with  Pain   + genera body pain and back pain. Sx x 3 days. 8:38 PM Pt resting, NAD noted at this time, respirations even and regular.8:51 PM Provider deemed patient stable for discharge. Discharge instructions provided and reviewed, patient verbalized understanding and no further questions at this time.

## 2023-06-18 NOTE — ED Provider Notes
 Chief Complaint Patient presents with  Pain   + genera body pain and back pain. Sx x 3 days. MDM -------------------------------------------------------------------------------------------------------------------------------Emergency Medicine Resident Note History of Present Illness:Michelle Barron is a 57 y.o. female with a pertinent past medical history of GERD, hepatitis B on tenofovir, fatty liver, anxiety, and depression who presents with generalized pain. Pt reports several weeks to months of generalized pain. Notes pain is primarily in her lower back, lower abdomen, and headache. She is tearful and notes it has gotten worse and needs help. Also reports 2-3 days of urinary frequency, chills, and nausea. Notes she has been seen by GYN recently and given abx for an unspecified infection. Denies chest pain, dyspnea, fever, vomiting, dysuria, gross hematuria.Assessment & ZOX:[id number] exam findings: tearful, anxious, no acute distress. Hemodynamically stable and afebrile. Abdomen soft with minimal suprapubic tenderness to palpation. No guarding or rebound. Bilateral lumbar paraspinal tenderness, no spinal tenderness. No CVA tenderness. Heart RRR. Lungs CTAB. No focal neuro deficits. Ddx includes but is not limited to: acute on chronic back pain, UTI, STI vs vaginal candidiasis less likely given negative test of cure 3 days ago, electrolyte abnRemaining review of systems and physical exam as detailed above.ED Course & Clinical Impression:- CBC, BMP, UA ordered out of triage. LFTs added on given hx of hepatitis. Recent negative [place] A/P, possible hepatic lesion found w/ outpatient MRI recommended.- Toradol ordered for symptomatic relief, however pt declined- Labs reviewed and overall unremarkable- no electrolyte abn, leukocytosis, or anemia. UA w/o WBC or RBC but many bacteria, suspect contamination- Pt open to receiving toradol. On reevaluation, notes pain is improved after meds.- Findings discussed with pt. Recommended follow-up with PCP. Discussed finding on recent [place] of right hepatic lobe lesion which should be evaluated with outpatient liver protocol MRI per radiology recs. Recommended warm compresses for lower back pain, PT, and tylenol/ibuprofen as needed. Pt discharged in stable condition in no acute distress.The patient was presented to and cared for under the guidance of Dr. Ladona Ridgel, the attending physician. Michelle Barron, MDEmergency Medicine Resident10/22/2024 4:36 PMReachable on Mobile Heartbeat Please excuse any typos in dictation--------------------------------------------------------------------------------------------------_  Physical ExamED Triage Vitals [06/17/23 1337]BP: 137/85Pulse: 83Pulse from  O2 sat: n/aResp: 18Temp: 98.2 ?F (36.8 ?C)Temp src: OralSpO2: 99 % BP 131/87  - Pulse 80  - Temp 98.4 ?F (36.9 ?C) (Oral)  - Resp 16  - SpO2 98% Physical Exam ProceduresAttestation/Critical CarePatient Reevaluation: Attending Supervised: ResidentI saw and examined the patient. I agree with the findings and plan of care as documented in the resident's note except as noted below. Additional acute and/or chronic problems addressed: 57 year old female with generalized pain.  Noted prior history of similar.  No Acute injuries or other infectious metabolic signs or symptoms.  Plan:  Basic labs pain control and re-evaluation.Michelle Barron Patient progress: stableClinical Impressions as of 06/18/23 1638 Generalized pain  ED DispositionDischarge Michelle Barron, MDResident10/21/24 2336 Michelle Barron, MD10/22/24 [phone]

## 2023-06-21 ENCOUNTER — Inpatient Hospital Stay: Admit: 2023-06-21 | Discharge: 2023-06-21 | Payer: MEDICAID | Primary: Internal Medicine

## 2023-06-21 ENCOUNTER — Encounter: Admit: 2023-06-21 | Payer: PRIVATE HEALTH INSURANCE | Primary: Internal Medicine

## 2023-06-21 DIAGNOSIS — R519 Headache: Secondary | ICD-10-CM

## 2023-06-21 DIAGNOSIS — H9319 Tinnitus, unspecified ear: Secondary | ICD-10-CM

## 2023-06-21 DIAGNOSIS — K760 Fatty (change of) liver, not elsewhere classified: Secondary | ICD-10-CM

## 2023-06-21 DIAGNOSIS — Z1231 Encounter for screening mammogram for malignant neoplasm of breast: Secondary | ICD-10-CM

## 2023-06-21 DIAGNOSIS — R0602 Shortness of breath: Secondary | ICD-10-CM

## 2023-06-21 DIAGNOSIS — F32A Depression: Secondary | ICD-10-CM

## 2023-06-21 DIAGNOSIS — F419 Anxiety disorder, unspecified: Secondary | ICD-10-CM

## 2023-06-21 DIAGNOSIS — K219 Gastro-esophageal reflux disease without esophagitis: Secondary | ICD-10-CM

## 2023-07-03 NOTE — Other
 BIRADS-1, negative

## 2023-07-11 ENCOUNTER — Encounter: Admit: 2023-07-11 | Payer: MEDICAID | Attending: Ophthalmology | Primary: Internal Medicine

## 2023-07-17 ENCOUNTER — Encounter: Admit: 2023-07-17 | Payer: PRIVATE HEALTH INSURANCE | Attending: Ophthalmology | Primary: Internal Medicine

## 2023-07-17 MED ORDER — RESTASIS MULTIDOSE 0.05 % EYE DROPS
0.05 | Freq: Two times a day (BID) | OPHTHALMIC | 12 refills | Status: AC
Start: 2023-07-17 — End: ?

## 2023-07-31 ENCOUNTER — Encounter: Admit: 2023-07-31 | Payer: PRIVATE HEALTH INSURANCE | Attending: Ophthalmology | Primary: Internal Medicine

## 2023-07-31 ENCOUNTER — Encounter: Admit: 2023-07-31 | Payer: MEDICAID | Attending: Ophthalmology | Primary: Internal Medicine

## 2023-07-31 DIAGNOSIS — F419 Anxiety disorder, unspecified: Secondary | ICD-10-CM

## 2023-07-31 DIAGNOSIS — F32A Depression: Secondary | ICD-10-CM

## 2023-07-31 DIAGNOSIS — H04123 Dry eye syndrome of bilateral lacrimal glands: Secondary | ICD-10-CM

## 2023-07-31 DIAGNOSIS — K219 Gastro-esophageal reflux disease without esophagitis: Secondary | ICD-10-CM

## 2023-07-31 DIAGNOSIS — R519 Headache: Secondary | ICD-10-CM

## 2023-07-31 DIAGNOSIS — H9319 Tinnitus, unspecified ear: Secondary | ICD-10-CM

## 2023-07-31 DIAGNOSIS — H2513 Age-related nuclear cataract, bilateral: Secondary | ICD-10-CM

## 2023-07-31 DIAGNOSIS — H5203 Hypermetropia, bilateral: Secondary | ICD-10-CM

## 2023-07-31 DIAGNOSIS — K760 Fatty (change of) liver, not elsewhere classified: Secondary | ICD-10-CM

## 2023-07-31 NOTE — Progress Notes
 Cataracts both eyes- Mild, NVS- ObserveDry eyes both eyes- Using ATs QID- Getting blurring and burning sensation with restasisHyperopia with presbyopia- MRx updatedPlan:ATs QID, tear gel at bedtimeStop restasisStart pataday

## 2023-07-31 NOTE — Patient Instructions
 Stop restasisContinue artificial tearsUse pataday (over the counter) 1 x day

## 2023-09-02 ENCOUNTER — Ambulatory Visit: Admit: 2023-09-02 | Payer: MEDICAID | Attending: Otolaryngology | Primary: Internal Medicine

## 2023-09-02 ENCOUNTER — Encounter: Admit: 2023-09-02 | Payer: PRIVATE HEALTH INSURANCE | Attending: Otolaryngology | Primary: Internal Medicine

## 2023-09-02 VITALS — HR 81 | Temp 97.20000°F | Ht 61.0 in | Wt 162.0 lb

## 2023-09-02 DIAGNOSIS — K760 Fatty (change of) liver, not elsewhere classified: Secondary | ICD-10-CM

## 2023-09-02 DIAGNOSIS — H9319 Tinnitus, unspecified ear: Secondary | ICD-10-CM

## 2023-09-02 DIAGNOSIS — F419 Anxiety disorder, unspecified: Secondary | ICD-10-CM

## 2023-09-02 DIAGNOSIS — R519 Headache: Secondary | ICD-10-CM

## 2023-09-02 DIAGNOSIS — K219 Gastro-esophageal reflux disease without esophagitis: Secondary | ICD-10-CM

## 2023-09-02 DIAGNOSIS — H9313 Tinnitus, bilateral: Secondary | ICD-10-CM

## 2023-09-02 DIAGNOSIS — F32A Depression: Secondary | ICD-10-CM

## 2023-09-02 MED ORDER — FLUTICASONE PROPIONATE 50 MCG/ACTUATION NASAL SPRAY,SUSPENSION
50 | Freq: Every day | NASAL | 6 refills | Status: AC
Start: 2023-09-02 — End: ?

## 2023-09-02 NOTE — Progress Notes
 Michelle Barron has bilateral tinnitus, which improves when she pops her ears which she cannot always do. She cannot pop the ears in the office. An audiogram showed mild low frequency hearing loss. Exam shows mild nasal mucosal edema.  Tympanic membranes are normal.  Oropharyngeal and cervical exams were normal.  There is no TMJ tenderness. Michelle Barron has bilateral tinnitus which may be exacerbated by eustachian tube dysfunction with inability to pop the ears. I gave her Flonase to use and asked her to continue to try to pop the ears. We discussed ways of managing tinnitus including using background noise and stress management.25 min visit/ 15 minutes counceling

## 2023-09-09 ENCOUNTER — Encounter: Admit: 2023-09-09 | Payer: MEDICAID | Attending: Otolaryngology | Primary: Internal Medicine

## 2023-09-27 ENCOUNTER — Telehealth: Admit: 2023-09-27 | Payer: PRIVATE HEALTH INSURANCE | Attending: Obstetrics and Gynecology | Primary: Internal Medicine

## 2023-09-27 NOTE — Telephone Encounter
 Michelle Barron, pt of [facility] calling to report vaginal pain, burning with urination and frequency with urination  x 3 days.Pt denies fever,hematuria, back pain.Pt requests CB w/ advice:[phone]

## 2023-09-27 NOTE — Telephone Encounter
 Spoke to pt , informed I am recommending evaluation in urgent care as it is late on a Friday and urgent care would best be able to treat her .

## 2023-09-27 NOTE — Telephone Encounter
 Copied from CRM #[id number]. Topic: Clinical Triage - Adult>> Sep 27, 2023  3:35 PM Mosetta Putt wrote:Vaginal discomfort

## 2023-10-03 ENCOUNTER — Emergency Department: Admit: 2023-10-03 | Payer: MEDICAID | Primary: Internal Medicine

## 2023-10-03 ENCOUNTER — Inpatient Hospital Stay: Admit: 2023-10-03 | Discharge: 2023-10-03 | Payer: MEDICAID

## 2023-10-03 DIAGNOSIS — R1033 Periumbilical pain: Secondary | ICD-10-CM

## 2023-10-03 DIAGNOSIS — R1032 Left lower quadrant pain: Secondary | ICD-10-CM

## 2023-10-03 LAB — CBC WITH AUTO DIFFERENTIAL
BKR WAM ABSOLUTE IMMATURE GRANULOCYTES.: 0.01 x 1000/ÂµL (ref 0.00–0.30)
BKR WAM ABSOLUTE LYMPHOCYTE COUNT.: 4.23 x 1000/ÂµL — ABNORMAL HIGH (ref 0.60–3.70)
BKR WAM ABSOLUTE NRBC (2 DEC): 0 x 1000/ÂµL (ref 0.00–1.00)
BKR WAM ANC (ABSOLUTE NEUTROPHIL COUNT): 4.42 x 1000/ÂµL (ref 2.00–7.60)
BKR WAM BASOPHIL ABSOLUTE COUNT.: 0.06 x 1000/ÂµL (ref 0.00–1.00)
BKR WAM BASOPHILS: 0.6 % (ref 0.0–1.4)
BKR WAM EOSINOPHIL ABSOLUTE COUNT.: 0.12 x 1000/ÂµL (ref 0.00–1.00)
BKR WAM EOSINOPHILS: 1.2 % (ref 0.0–5.0)
BKR WAM HEMATOCRIT (2 DEC): 40.9 % (ref 35.00–45.00)
BKR WAM HEMOGLOBIN: 13.1 g/dL (ref 11.7–15.5)
BKR WAM IMMATURE GRANULOCYTES: 0.1 % (ref 0.0–1.0)
BKR WAM LYMPHOCYTES: 43.8 % (ref 17.0–50.0)
BKR WAM MCH (PG): 28.7 pg (ref 27.0–33.0)
BKR WAM MCHC: 32 g/dL (ref 31.0–36.0)
BKR WAM MCV: 89.5 fL (ref 80.0–100.0)
BKR WAM MONOCYTE ABSOLUTE COUNT.: 0.82 x 1000/ÂµL (ref 0.00–1.00)
BKR WAM MONOCYTES: 8.5 % (ref 4.0–12.0)
BKR WAM MPV: 9.1 fL (ref 8.0–12.0)
BKR WAM NEUTROPHILS: 45.8 % (ref 39.0–72.0)
BKR WAM NUCLEATED RED BLOOD CELLS: 0 % (ref 0.0–1.0)
BKR WAM PLATELETS: 297 x1000/ÂµL (ref 150–420)
BKR WAM RDW-CV: 13.2 % (ref 11.0–15.0)
BKR WAM RED BLOOD CELL COUNT.: 4.57 M/ÂµL (ref 4.00–6.00)
BKR WAM WHITE BLOOD CELL COUNT: 9.7 x1000/ÂµL (ref 4.0–11.0)

## 2023-10-03 LAB — HEPATIC FUNCTION PANEL
BKR A/G RATIO: 1.5 (ref 1.0–2.2)
BKR ALANINE AMINOTRANSFERASE (ALT): 20 U/L (ref 10–35)
BKR ALBUMIN: 4.5 g/dL (ref 3.6–5.1)
BKR ALKALINE PHOSPHATASE: 69 U/L (ref 9–122)
BKR ASPARTATE AMINOTRANSFERASE (AST): 21 U/L (ref 10–35)
BKR AST/ALT RATIO: 1.1
BKR BILIRUBIN DIRECT: 0.1 mg/dL (ref <=0.2)
BKR BILIRUBIN TOTAL: 0.4 mg/dL (ref <=1.2)
BKR GLOBULIN: 3 g/dL (ref 2.0–3.9)
BKR PROTEIN TOTAL: 7.5 g/dL (ref 5.9–8.3)

## 2023-10-03 LAB — URINALYSIS WITH CULTURE REFLEX      (BH LMW YH)
BKR BILIRUBIN, UA: NEGATIVE
BKR GLUCOSE, UA: NEGATIVE
BKR KETONES, UA: NEGATIVE
BKR LEUKOCYTE ESTERASE, UA: NEGATIVE
BKR NITRITE, UA: NEGATIVE
BKR PH, UA: 6 (ref 5.5–7.5)
BKR PROTEIN, UA: NEGATIVE
BKR SPECIFIC GRAVITY, UA: 1.008 (ref 1.005–1.030)
BKR UROBILINOGEN, UA (MG/DL): <2 mg/dL (ref <=2.0)

## 2023-10-03 LAB — BASIC METABOLIC PANEL
BKR ANION GAP: 12 (ref 7–17)
BKR BLOOD UREA NITROGEN: 16 mg/dL (ref 6–20)
BKR BUN / CREAT RATIO: 17.2 (ref 8.0–23.0)
BKR CALCIUM: 9.5 mg/dL (ref 8.8–10.2)
BKR CHLORIDE: 104 mmol/L (ref 98–107)
BKR CO2: 24 mmol/L (ref 20–30)
BKR CREATININE DELTA: 0.04
BKR CREATININE: 0.93 mg/dL (ref 0.40–1.30)
BKR EGFR, CREATININE (CKD-EPI 2021): >60 mL/min/{1.73_m2} (ref >=60)
BKR GLUCOSE: 100 mg/dL (ref 70–100)
BKR POTASSIUM: 4.3 mmol/L (ref 3.3–5.3)
BKR SODIUM: 140 mmol/L (ref 136–144)

## 2023-10-03 LAB — URINE MICROSCOPIC     (BH GH LMW YH)

## 2023-10-03 LAB — UA REFLEX CULTURE

## 2023-10-03 LAB — LIPASE: BKR LIPASE: 60 U/L — ABNORMAL HIGH (ref 11–55)

## 2023-10-03 MED ORDER — NITROFURANTOIN MONOHYDRATE/MACROCRYSTALS 100 MG CAPSULE
100 | ORAL_CAPSULE | Freq: Two times a day (BID) | ORAL | 1 refills | Status: AC
Start: 2023-10-03 — End: ?

## 2023-10-03 MED ORDER — IOHEXOL 350 MG IODINE/ML INTRAVENOUS SOLUTION
350 | Freq: Once | INTRAVENOUS | Status: CP | PRN
Start: 2023-10-03 — End: ?
  Administered 2023-10-03: 18:00:00 350 mL via INTRAVENOUS

## 2023-10-03 MED ORDER — SODIUM CHLORIDE 0.9 % LARGE VOLUME SYRINGE FOR AUTOINJECTOR
Freq: Once | INTRAVENOUS | Status: CP | PRN
Start: 2023-10-03 — End: ?
  Administered 2023-10-03: 18:00:00 via INTRAVENOUS

## 2023-10-03 MED ORDER — ACETAMINOPHEN 325 MG TABLET
325 | Freq: Once | ORAL | Status: CP
Start: 2023-10-03 — End: ?
  Administered 2023-10-03: 17:00:00 325 mg via ORAL

## 2023-10-03 NOTE — ED Provider Notes
 Chief Complaint Patient presents with  Abdominal Pain   LLQ pain x1 week. Hx of same in the past without dx. No nausea/vomiting. Last bowel movement yesterday. No fever/CP/SOB.  HPI/PE:The patient is a 58yo female with PMHx of GERD, hepatitis B on tenofovir, fatty liver, anxiety, and depression presenting to the ED with concern for lower abdominal pain and increased urinary urgency and frequency x2 weeks. The patient notes having similar symptoms about 4 months ago at which time she was seen by her OBGYN outpt who did not find anything remarkable. Denies specific provoking factors to the pain. Notes occasional burning with urination as well and states that she intermittently develops sharp vaginal pain lasting 1-2 seconds that then resolves on its own. Having nausea but denies vomiting. Notes episode of chills several days ago that since resolved. Denies sick contacts. Denies fever, CP, SOB, dizziness, HA, vomiting, blood in urine or stools, diarrhea, constipation or inability to pass flatus, vaginal bleeding, vaginal discharge, genital lesions or additional symptoms at this time. MDM:Ddx: uti, considered but lower suspicion for pyelonephritis or nephrolithiasis, gastroenteritis, possible acute abdominal pathology though less likely will obtain [place] abdomen pelvis, POP, vaginosis consideredLabs and ED course:-cbc, bmp - wbcs 9.7, hgb 13.1, Cr 0.93-lfts, lipase - lipase 60-ua - 3-5 RBCS, few bacteria-upreg - negative-vaginosis/vaginitis panel-[place] abdomen pelvisSummary:The patient is a 58 year old female with a past medical history ofGERD, hepatitis B on tenofovir, fatty liver, anxiety, and depression presenting to the ED with concern for lower abdominal pain and increased urinary urgency and frequency and dysuria x2 weeks.  Patient alert in no acute distress with elevated BP but otherwise stable vital signs on presentation. -CVAT bilaterally.  Abdomen is soft nondistended but tender to the periumbilical region.  GU exam performed with chaperone present which showed thin gray discharge, vaginosis/vaginitis swab collected and will be sent out for further testing.  Labs largely reassuring, small elevation in lipase noted but given no evidence of pancreatitis on [place] and minimal elevation of lipase, informed patient and recommended she follow up for repeat blood work with PCP in the outpatient setting.  Labs otherwise reassuring and [place] shows no acute abdominal pathology.  Few bacteria and RBCs noted on UA, low suspicion for UTI but discussed with the patient and given significant urinary symptoms will initiate antibiotic treatment with Macrobid, culture sent to lab for further evaluation. At this time, patient is safe for discharge with Macrobid, supportive care, close PCP follow up. Given strict return precautions. Patient verbalized understanding, is amenable with plan, all questions answered. Dr. Dorethea Clan available for discussion for this patientAn acute or life threatening problem was considered during this evaluation  A decision regarding hospitalization was made during this visit  Patient does not require admission or further ED Observation at this time  External data reviewed: Radiology, Labs and Notes (OSH or non-ED)Independent interpretation of: [place] (No overt obstruction)  Physical ExamED Triage Vitals [10/03/23 1638]BP: (!) 163/109Pulse: 78Pulse from  O2 sat: n/aResp: 18Temp: 97.1 ?F (36.2 ?C)Temp src: TemporalSpO2: 98 % BP (!) 146/93  - Pulse 80  - Temp 97.6 ?F (36.4 ?C)  - Resp 18  - Ht 5' 1 (1.549 m)  - Wt 72.6 kg  - SpO2 100%  - BMI 30.23 kg/m? Physical ExamConstitutional:     General: She is not in acute distress.   Appearance: She is well-developed. She is not ill-appearing. Cardiovascular:    Rate and Rhythm: Normal rate and regular rhythm.    Heart sounds: Normal heart sounds. No murmur  heard.Pulmonary:    Effort: Pulmonary effort is normal. No respiratory distress.    Breath sounds: Normal breath sounds. No wheezing or rales. Abdominal:    General: Abdomen is flat.    Palpations: Abdomen is soft.    Tenderness: There is abdominal tenderness in the periumbilical area. There is no right CVA tenderness, left CVA tenderness, guarding or rebound. Negative signs include Murphy's sign. Genitourinary:   Comments: Performed with chaperone present; small amount of bright thin discharge noted in vaginal vault; -CMT, no genital lesions or overt pelvic organ prolapseSkin:   General: Skin is warm and dry. Neurological:    General: No focal deficit present.    Mental Status: She is alert and oriented to person, place, and time.    Motor: No weakness.  ProceduresAttestation/Critical CareClinical Impressions as of 10/03/23 2340 Abdominal pain, unspecified abdominal location  ED DispositionDischarge  Scotty Court, PA02/06/25 2340

## 2023-10-03 NOTE — Discharge Instructions
 You were seen in the Emergency Department today for abdominal pain. You were evaluated and your [place] scan showed no concerning abdominal conditions. You were given a prescription for macrobid, an antibiotic, that was sent to your pharmacy for you to pick up after leaving the Emergency Department. Please take this medication as the instructions provided by the pharmacy outline. A culture of your urine and swabs for vaginosis were sent to the lab for testing, results will be available in mychart in 24-48 hours and you will be called with any positive results. If you have pain, you may take ibuprofen 600mg  every 4-6 hours and tylenol 650mg  every 6 hours as needed for relief. Please follow up with your primary care provider in the next 1-2 days for further evaluation of your concerns and for repeat lipase (blood work) and urine testing. If you develop any new or worsening symptoms, such as vomiting, inability to eat or drink, fever, blood in urine or stool, please return to the Emergency Department for further evaluation. Feel better!

## 2023-10-04 LAB — CHLAMYDIA TRACHOMATIS, NAAT (LAB ORDER ONLY) (BH GH L LMW YH): BKR CHLAMYDIA, DNA PROBE: NEGATIVE

## 2023-10-04 LAB — CANDIDA & TRICHOMONAS VAGINITIS BY NAAT (BH GH LMW YH)
BKR CANDIDA GLABRATA NAAT: NEGATIVE
BKR CANDIDA SPECIES GROUP NAAT: NEGATIVE
BKR TRICHOMONAS VAGINALIS NAAT: NEGATIVE

## 2023-10-04 LAB — NEISSERIA GONORRHEA, NAAT (LAB ORDER ONLY)   (BH GH L LMW YH): BKR NEISSERIA GONORRHOEAE, DNA PROBE: NEGATIVE

## 2023-10-04 LAB — BACTERIAL VAGINOSIS BY NAAT: BKR BACTERIAL VAGINOSIS NAAT: NEGATIVE

## 2023-10-04 NOTE — Telephone Encounter
 Received negative STD results for patient.  Chart reviewed and noted she was discharged on Macrobid for possible UTI.  Urinalysis did not go to culture.  Call was placed to [facility] to see how she is feeling and discuss. A personal voice message was left for Mateja to return the call to the follow-up office.

## 2023-10-22 ENCOUNTER — Encounter: Admit: 2023-10-22 | Payer: PRIVATE HEALTH INSURANCE | Primary: Internal Medicine

## 2023-10-22 ENCOUNTER — Encounter: Admit: 2023-10-22 | Payer: MEDICAID | Attending: Obstetrics and Gynecology | Primary: Internal Medicine

## 2023-10-22 ENCOUNTER — Ambulatory Visit: Admit: 2023-10-22 | Payer: MEDICAID | Primary: Internal Medicine

## 2023-10-22 ENCOUNTER — Ambulatory Visit: Admit: 2023-10-22 | Payer: MEDICAID | Attending: Obstetrics and Gynecology | Primary: Internal Medicine

## 2023-10-22 VITALS — BP 119/79 | HR 75 | Resp 20 | Ht 61.0 in | Wt 164.4 lb

## 2023-10-22 DIAGNOSIS — L292 Pruritus vulvae: Secondary | ICD-10-CM

## 2023-10-22 DIAGNOSIS — R350 Frequency of micturition: Secondary | ICD-10-CM

## 2023-10-22 DIAGNOSIS — Z0000 Encounter for general adult medical examination without abnormal findings: Secondary | ICD-10-CM

## 2023-10-22 MED ORDER — CLOTRIMAZOLE-BETAMETHASONE 1 %-0.05 % TOPICAL CREAM
1-0.05 | Freq: Two times a day (BID) | TOPICAL | 1 refills | Status: AC
Start: 2023-10-22 — End: ?

## 2023-10-22 NOTE — Progress Notes
 [facility] OF MEDICINE						Department of Obstetrics, Gynecology, Reproductive MedicineCenter for [facility] and Midwifery Michelle Barron is a 58 y.o. [phone] female who presents for f/u after going to the ED for LLQ pain on 10/03/2023. She was treated for urinary s/s. [place] scan was normal, vaginal swab results also normal. She denies GI s/s. Pt reports that she continues to experience intermittent urinary s/s and vulvar itching. She denies vaginal s/s. Past Medical History: Diagnosis Date  Anxiety   Depression   Fatty liver   GERD (gastroesophageal reflux disease)   Headache   Tinnitus   with headaches GYN History: No LMP recorded. Patient is postmenopausal. Contraceptive method: menopause/abstinenceCurrent partner: no partner currentlyHistory of STI: noOB History   Gravida 3  Para 2  Term 1  Preterm    AB    Living 2   SAB    IAB    Ectopic    Molar    Multiple    Live Births 2    Social History Tobacco Use  Smoking status: Never  Smokeless tobacco: Never Vaping Use  Vaping status: Never Used Substance Use Topics  Alcohol use: No  Drug use: Never       Prior to Admission Medications Medication Name Sig Taking? Patient Reported   busPIRone (BUSPAR) 5 mg tabletLast dose:  --  TAKE 1 TABLET BY MOUTH 1-2 TIMES A DAY AS NEEDED   Yes   chlorhexidine gluconate (PERIDEX) 0.12 % solutionLast dose:  --  Swish 1 capful in mouth for 1 minute and spit out once a day in the evening and after brushingPatient not taking: Reported on 06/19/2023       cholecalciferol, vitamin D3, 25 mcg (1,000 unit) tabletLast dose:  --  Take 1 tablet (1,000 Units total) by mouth daily.Patient not taking: Reported on 06/19/2023   Yes   clotrimazole-betamethasone (LOTRISONE) 1-0.05 % creamLast dose:  --  Apply topically 2 (two) times daily.       fesoterodine (TOVIAZ) 8 mg tabletLast dose:  --  Take 1 tablet (8 mg total) by mouth daily.       fluconazole (DIFLUCAN) 150 mg tabletLast dose:  --  Take 1 tablet (150 mg total) by mouth every 3 (three) days.Patient not taking: Reported on 06/19/2023       fluticasone propionate (FLONASE) 50 mcg/actuation nasal sprayLast dose:  --  Use 2 sprays in each nostril daily.       hydrocortisone (HYTONE) 2.5 % creamLast dose:  --  Apply topically daily.Patient not taking: Reported on 04/16/2023       magnesium oxide 420 mg TabLast dose:  --  Take 400 mg by mouth daily.   Yes   RESTASIS MULTIDOSE 0.05 % ophthalmic solutionLast dose:  --  APPLY 1 DROP TO EYE 2 TIMES DAILY (0900, 1700).       tenofovir disoproxil (VIREAD) 300 mg tabletLast dose:  --  TAKE 1 TABLET BY MOUTH EVERY DAY   Yes   Prior to admission medications last reviewed by Bluford Main on Mon Sep 02, 2023 1203 No Known AllergiesReview of Systems: General: Denies fevers. GI: Denies abdominal pain. GU: Denies dysuria and urinary frequency.?Objective:  BP 119/79  - Pulse 75  - Resp 20  - Ht 5' 1 (1.549 m)  - Wt 74.6 kg  - SpO2 100%  - BMI 31.06 kg/m? General: calm, cooperative, in NADAbdomen: non-distendedPelvic: normal female genitalia, no lesions or erythemaVagina: speculum exam deferred, vaginosis swab collected Cervix: speculum exam deferredUterus: deferredAdnexa: deferredThis exam  was completed by Roselyn Bering, CNM and chaperoned by the following: Chaperone Name, Role/Title: Darcus Austin, ACAAssessment: 1. Urinary frequency (Primary)Clean catch urine collected and sent to lab.Advised pt to increase fluid intake, void q 3 hrs.Referral to urology sent; advised pt that she will be contacted to schedule an appt. - Urinalysis with culture reflex; Future- Ambulatory referral to Urology2. Vulvar itchingVaginal swab collected and sent to lab.Rx sent to pharmacy.- Vaginosis & Vaginitis Panel by NA[facility] ([facility]); Future- clotrimazole-betamethasone (LOTRISONE) 1-0.05 % cream; Apply topically 2 (two) times daily.  Dispense: 30 g; Refill: 0Plan:  Reviewed vulvar hygiene practices. Encouraged to avoid soaps and vaginal hygiene products. Will follow-up on testingEncouraged regular condom use for STI prevention. Return to care for annual exam and as neededJill Kristie Bracewell, CNM

## 2023-10-23 LAB — URINALYSIS WITH CULTURE REFLEX      (BH LMW YH)
BKR BILIRUBIN, UA: NEGATIVE
BKR GLUCOSE, UA: NEGATIVE
BKR KETONES, UA: NEGATIVE
BKR LEUKOCYTE ESTERASE, UA: NEGATIVE
BKR NITRITE, UA: NEGATIVE
BKR PH, UA: 6 (ref 5.5–7.5)
BKR PROTEIN, UA: NEGATIVE
BKR SPECIFIC GRAVITY, UA: 1.028 (ref 1.005–1.030)
BKR UROBILINOGEN, UA (MG/DL): <2 mg/dL (ref <=2.0)

## 2023-10-23 LAB — URINE MICROSCOPIC     (BH GH LMW YH)
BKR RBC/HPF INSTRUMENT: 17 /HPF — ABNORMAL HIGH (ref 0–2)
BKR URINE SQUAMOUS EPITHELIAL CELLS, UA (NUMERIC): <1 /HPF (ref 0–5)
BKR WBC/HPF INSTRUMENT: 1 /HPF (ref 0–5)

## 2023-10-23 LAB — CANDIDA & TRICHOMONAS VAGINITIS BY NAAT (BH GH LMW YH)
BKR CANDIDA GLABRATA NAAT: NEGATIVE
BKR CANDIDA SPECIES GROUP NAAT: NEGATIVE
BKR TRICHOMONAS VAGINALIS NAAT: NEGATIVE

## 2023-10-23 LAB — UA REFLEX CULTURE

## 2023-10-23 LAB — BACTERIAL VAGINOSIS BY NAAT: BKR BACTERIAL VAGINOSIS NAAT: NEGATIVE

## 2023-10-28 ENCOUNTER — Telehealth: Admit: 2023-10-28 | Payer: PRIVATE HEALTH INSURANCE | Attending: Obstetrics and Gynecology | Primary: Internal Medicine

## 2023-10-28 NOTE — Telephone Encounter
 Copied from CRM #[id number]. Topic: General Message - [facility]>> Oct 28, 2023 10:22 AM Clois Comber wrote:[facility] MESSAGETime of call:   10:22 AMCaller:   Aubert, Christean RCaller's relationship to patient:  n/a  Calling from [employer], hospital, agency, etc.):  n/a   Specialist you are calling for:  [facility] for call:   Patient was seen on 10/22/23 and looking for her urine results If not feeling well, what are symptoms:  n/a   If having symptoms, how long have the symptoms been present:  n/a   Does caller request to speak to someone urgently?  no   Best telephone number for callback:   [phone] Best time to return call:   all day  Permission to leave message:  yes   [facility] Referral Specialist

## 2023-10-28 NOTE — Telephone Encounter
 Pt informed the urine test did not come back with a UTI. Had positive blood which pt is quite concerned about . She has a urology appt on 4/7 with pt was not aware of . Pt continues to have frequency . She also was not aware the urologist ordered medication for her back on October when she last saw her . The pt will follow up with that provider .

## 2023-10-29 ENCOUNTER — Ambulatory Visit: Admit: 2023-10-29 | Payer: MEDICAID | Attending: Urology | Primary: Internal Medicine

## 2023-10-29 ENCOUNTER — Encounter: Admit: 2023-10-29 | Payer: PRIVATE HEALTH INSURANCE | Attending: Urology | Primary: Internal Medicine

## 2023-10-29 ENCOUNTER — Encounter: Admit: 2023-10-29 | Payer: PRIVATE HEALTH INSURANCE | Attending: Obstetrics and Gynecology | Primary: Internal Medicine

## 2023-10-29 VITALS — Ht 61.0 in | Wt 164.5 lb

## 2023-10-29 DIAGNOSIS — K219 Gastro-esophageal reflux disease without esophagitis: Secondary | ICD-10-CM

## 2023-10-29 DIAGNOSIS — N3281 Overactive bladder: Secondary | ICD-10-CM

## 2023-10-29 DIAGNOSIS — F419 Anxiety disorder, unspecified: Secondary | ICD-10-CM

## 2023-10-29 DIAGNOSIS — R3129 Other microscopic hematuria: Secondary | ICD-10-CM

## 2023-10-29 DIAGNOSIS — R102 Pelvic and perineal pain: Secondary | ICD-10-CM

## 2023-10-29 DIAGNOSIS — K760 Fatty (change of) liver, not elsewhere classified: Secondary | ICD-10-CM

## 2023-10-29 DIAGNOSIS — R399 Unspecified symptoms and signs involving the genitourinary system: Principal | ICD-10-CM

## 2023-10-29 DIAGNOSIS — R519 Headache: Secondary | ICD-10-CM

## 2023-10-29 DIAGNOSIS — H9319 Tinnitus, unspecified ear: Secondary | ICD-10-CM

## 2023-10-29 DIAGNOSIS — F32A Depression: Secondary | ICD-10-CM

## 2023-10-29 MED ORDER — MIRABEGRON ER 25 MG TABLET,EXTENDED RELEASE 24 HR
25 | ORAL_TABLET | Freq: Every day | ORAL | 7 refills | Status: AC
Start: 2023-10-29 — End: ?

## 2023-10-29 MED ORDER — GABAPENTIN 300 MG CAPSULE
300 | Status: AC
Start: 2023-10-29 — End: ?

## 2023-10-29 MED ORDER — PREDNISONE 20 MG TABLET
20 | Status: AC
Start: 2023-10-29 — End: ?

## 2023-10-29 NOTE — Progress Notes
 URGENT Note Michelle Barron is a 58 y.o. female followed by Alfonse Ras APRN with a history of microscopic hematuria and OAB.She was previously on Vesicare but was recently switched to Toviaz for her OAB symptoms.He had a negative hematuria work up 2021.Recent Contrast Hart done 10/03/23 showed normal kidneys with symmetric enhancement, no stones or hydronephrosis and a normal bladder.She presents URGENTLY today complaining of urinary frequency, vaginal pain and vaginal bleeding.  She reports that she took the [place] prescribed by Cyndi until the RX ran out.  She isn't sure that it helped her urinary symptoms.  She reports a pain in her vagina that can be sharp at times.  She also states that she has vaginal bleeding.  When they had her swab her vagina in the office, she saw blood on the Q-tip.  They performed urine testing in the office 10/23/23 showed 17 RBCs, but her culture was negative.Past Medical History: Diagnosis Date  Anxiety   Depression   Fatty liver   GERD (gastroesophageal reflux disease)   Headache   Tinnitus   with headaches Past Surgical History: Procedure Laterality Date  APPENDECTOMY  20 yrs No Known AllergiesReview of Systems:No symptoms other than those noted in HPI and PMH.  All other systems negative.Physical ExamHt 5' 1 (1.549 m)  - Wt 74.6 kg  - BMI 31.08 kg/m? Lab Results Component Value Date  UCOLOR yellow 10/29/2023  UGLUCOSE Negative 10/29/2023  UKETONE Negative 10/29/2023  USPECGRAVITY 1.020 10/29/2023  UPROTEIN Trace 10/29/2023  UNITRATES Negative 10/29/2023  UBLOOD Positive 10/29/2023  ULEUKOCYTES Negative 10/29/2023    No results found for: PVRPOCTPhysical Exam Constitutional: Appears well-developed and well-nourished. Head: Normocephalic. Pulmonary/Chest: Effort normal. Musculoskeletal: Normal range of motion. Neurological: Alert and oriented to person, place, and time. Skin: Skin is warm and dry. Psychiatric: Has a normal mood and affect. Behavior is normal. Judgment and thought content normal. Assessment & Plan:Michelle Barron is a 58 y.o. female followed by Alfonse Ras APRN with a history of microscopic hematuria and OABEncounter Diagnoses Name SNOMED [place](R) Primary?  UTI symptoms URINARY SYMPTOMS Yes  Microhematuria MICROSCOPIC HEMATURIA   OAB (overactive bladder) OVERACTIVE URINARY BLADDER  Lengthy discussion with patient.Will prescribe Myrbetriq 25mg  for her OAB symptoms.RX sent to pharmacy.Warned of side effects of medication.Increase water intake.Avoid bladder irritants.UA results from GYN office reviewed.Patient reports vaginal bleeding that day.She is requesting a cystoscopy be done since GYN told her she needs this.However, I advised patient to reach to GYN re: her reports of vaginal bleeding and vaginal pain.  I also sent a message to her GYN.Follow up arranged with Cyndi Curto APRN to assess response to Myrbetriq.Ceasar Lund, [place]

## 2023-11-04 ENCOUNTER — Encounter: Admit: 2023-11-04 | Payer: PRIVATE HEALTH INSURANCE | Attending: Internal Medicine | Primary: Internal Medicine

## 2023-11-04 DIAGNOSIS — R319 Hematuria, unspecified: Secondary | ICD-10-CM

## 2023-11-04 DIAGNOSIS — N949 Unspecified condition associated with female genital organs and menstrual cycle: Secondary | ICD-10-CM

## 2023-11-07 ENCOUNTER — Telehealth: Admit: 2023-11-07 | Payer: PRIVATE HEALTH INSURANCE | Attending: Obstetrics and Gynecology | Primary: Internal Medicine

## 2023-11-07 NOTE — Telephone Encounter
 Pt informed there are no findings that would be causing pelvic pain , recommendation follow up with her PCP or GI provider . Pt informed I will let he know when the results are scanned in her my chart .

## 2023-11-07 NOTE — Telephone Encounter
-----   Message from Roselyn Bering, [place] sent at 11/07/2023  4:41 PM EDT -----Please let pt know that there are no findings on her pelvic u/s that would lead to pelvic pain. If she is still experiencing pain, I recommend f/u with her PCP or with a GI specialist. Thanks.

## 2023-11-08 ENCOUNTER — Encounter: Admit: 2023-11-08 | Payer: PRIVATE HEALTH INSURANCE | Attending: Obstetrics and Gynecology | Primary: Internal Medicine

## 2023-11-16 IMAGING — MR LOMBAR
4 of 7 series · 19 of 48 positions shown · non-contrast
Comparison: none

[Series 3: T2 · coronal · 6.0mm · 0.59mm/px · 5 of 12 slices shown (1 of 3)]
[im 1/12]
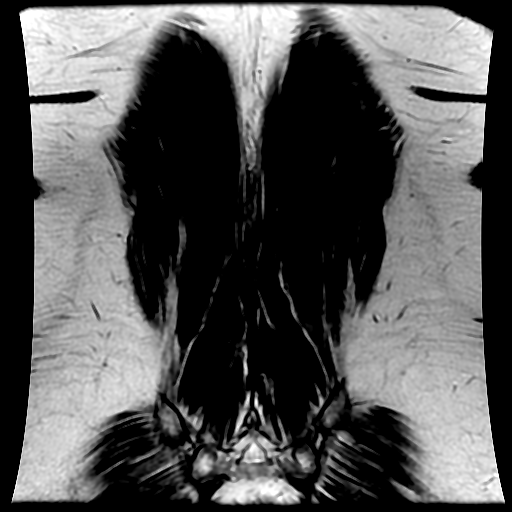
[im 3/12]
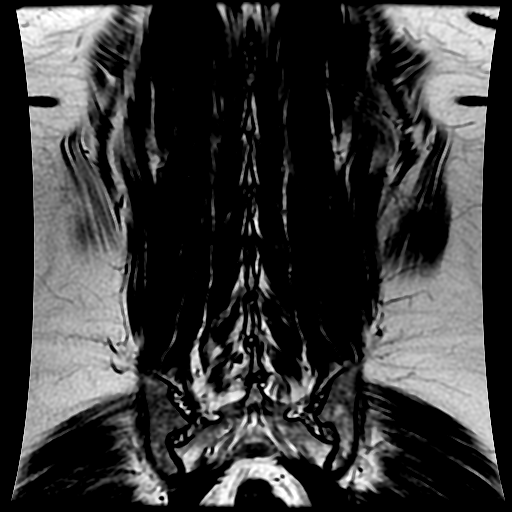
[im 6/12]
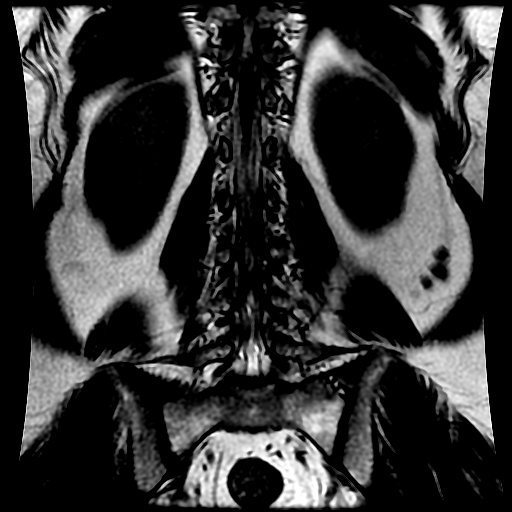
[im 9/12]
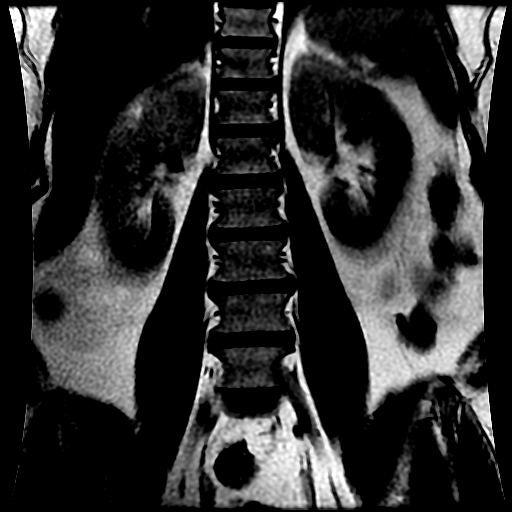
[im 12/12]
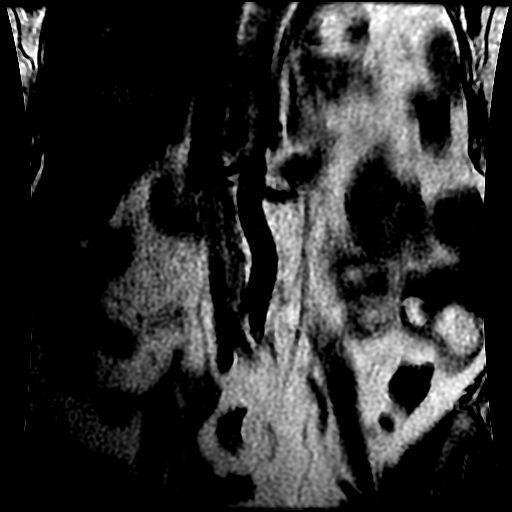

[Series 4: T2 · sagittal · 3.7mm · 0.59mm/px · 6 of 15 slices shown (2 of 3)]
[im 1/15]
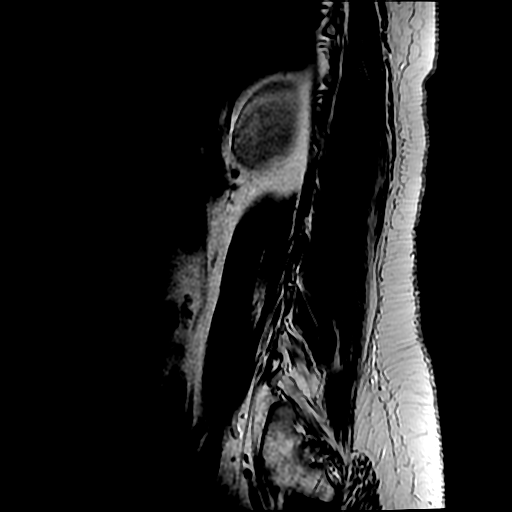
[im 3/15]
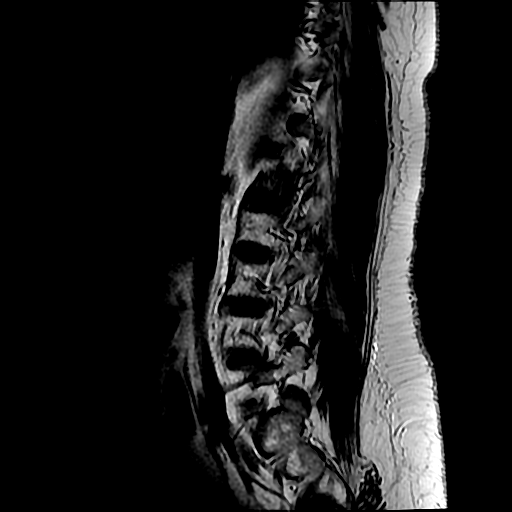
[im 6/15]
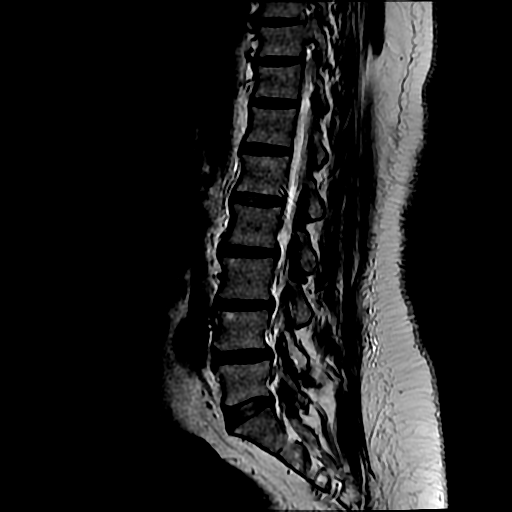
[im 9/15]
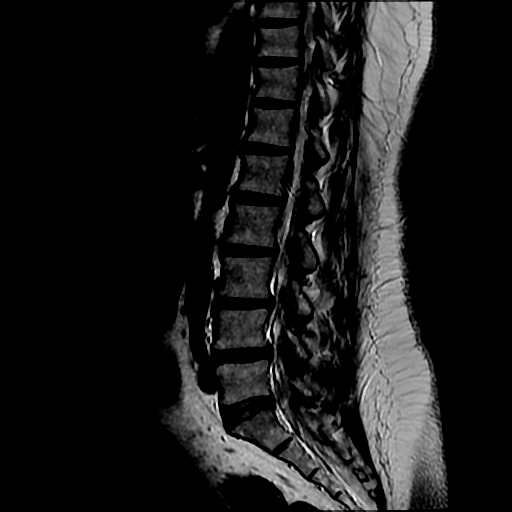
[im 12/15]
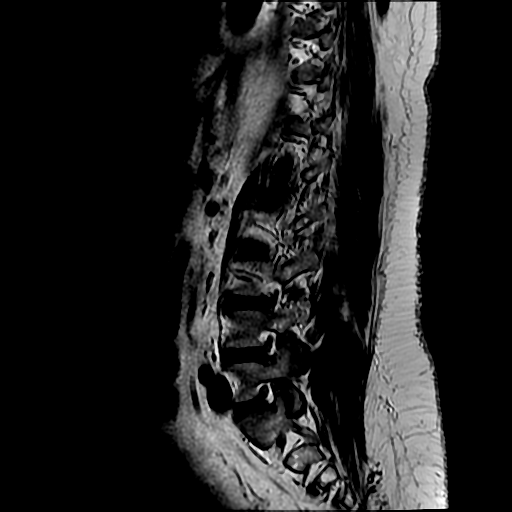
[im 15/15]
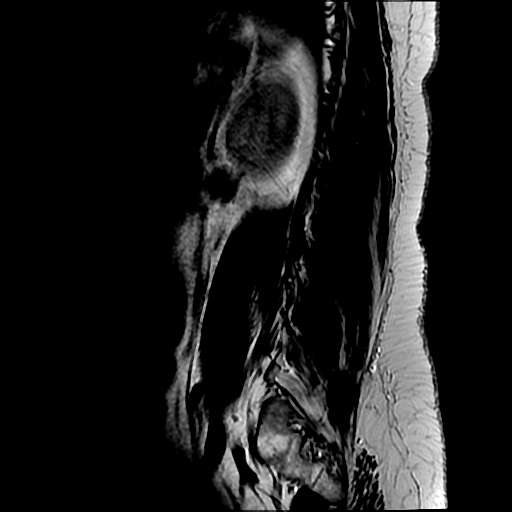

[Series 5: T1 · sagittal · 3.7mm · 0.59mm/px · 3 of 15 slices shown]
[im 3/15]
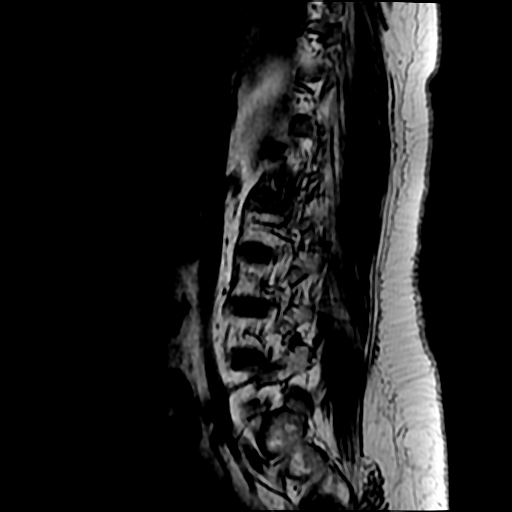
[im 9/15]
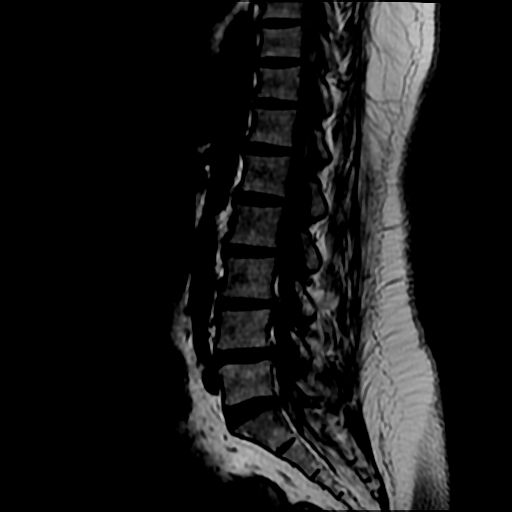
[im 15/15]
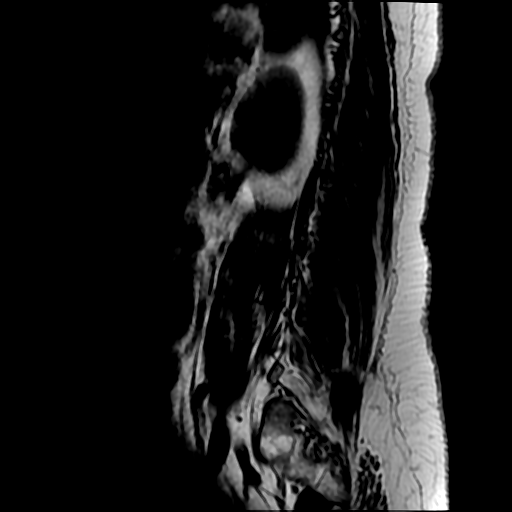

[Series 7: T2 · axial · 4.0mm · 0.39mm/px · z∈[-47,+127]mm · 5 of 25 slices shown (3 of 3)]
[im 1/25]
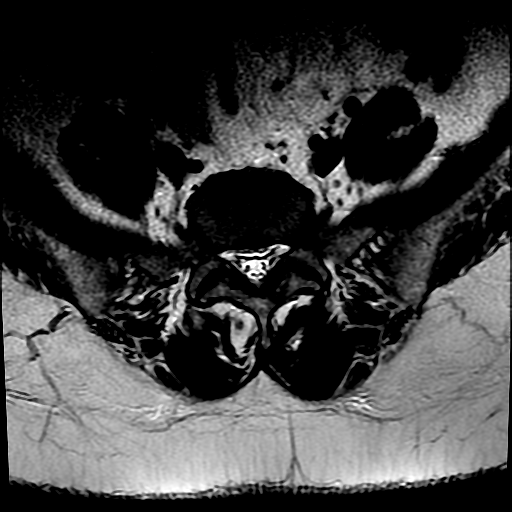
[im 3/25]
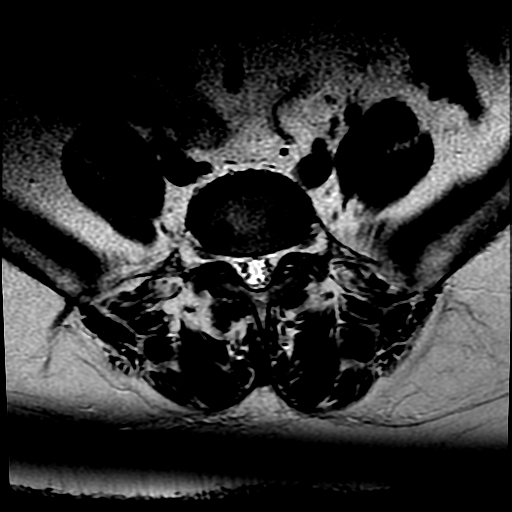
[im 9/25]
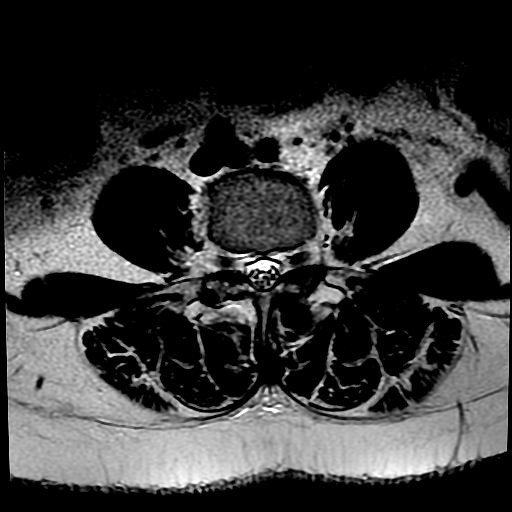
[im 14/25]
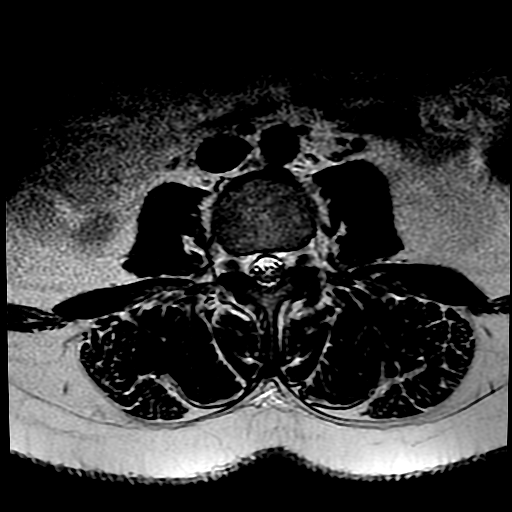
[im 22/25]
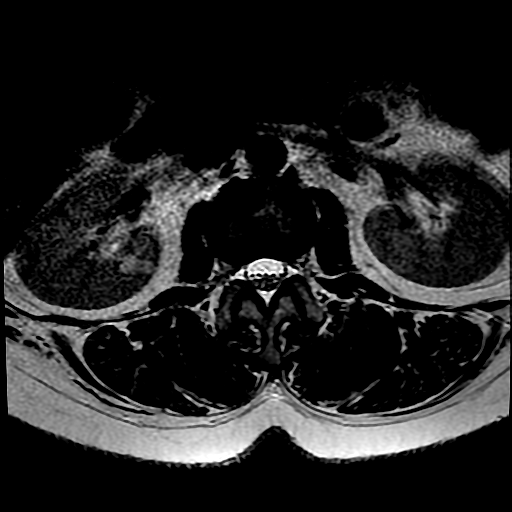

[19 of 48 positions shown; findings below may reference images not displayed]

RESSONÂNCIA MAGNÉTICA DE COLUNA LOMBAR
TÉCNICA:
Exame realizado em equipamento de ressonância magnética com sequências, ponderações e planos específicos para o segmento de interesse, sem a administração endovenosa do meio de contraste.

RESULTADO:
Discreta anterolistese grau 1 de L4, degenerativa.
Retificação da lordose fisiológica na posição de estudo.
Os corpos vertebrais possuem altura preservada.
Osteófitos marginais.
Redução do conteúdo hídrico dos discos intervertebrais lombares com redução da altura no segmento de L2-L3 a L4-L5.
Abaulamento discal assimétrico em L2-L3 com maior componente extruso foraminal à direita que comprime raiz emergente de L2.
Abaulamento discal simétrico em L3-L4 que toca as raízes descendentes de L4 e emergentes de L3.
Pseudoabaulamento discal simétrico em L4-L5 que toca as raízes emergentes de L4.
Hipertrofia degenerativa difusa das articulações interapofisárias, de forma mais acentuada em L3-L4 e L4-L5.
Cone medular tópico com calibre e intensidade de sinal habitual.
Raízes nervosas da cauda equina com morfologia, distribuição e intensidade de sinal preservados.
Pedículos de morfologia curta congênita.
Redução difusa da amplitude do canal vertebral predominando em L3-L4 e L4-L5.
Atrofia / lipossubstituição da musculatura paravertebral.

CONCLUSÃO:
Discreta anterolistese grau 1 de L4, degenerativa.
Espondilodiscoartropatia degenerativa multissegmentar detalhada no corpo do relatório.

## 2023-12-02 ENCOUNTER — Ambulatory Visit: Admit: 2023-12-02 | Payer: PRIVATE HEALTH INSURANCE | Attending: Urology | Primary: Internal Medicine

## 2023-12-10 ENCOUNTER — Inpatient Hospital Stay: Admit: 2023-12-10 | Discharge: 2023-12-10 | Payer: PRIVATE HEALTH INSURANCE | Primary: Internal Medicine

## 2023-12-10 ENCOUNTER — Ambulatory Visit: Admit: 2023-12-10 | Payer: PRIVATE HEALTH INSURANCE | Primary: Internal Medicine

## 2023-12-10 DIAGNOSIS — N949 Unspecified condition associated with female genital organs and menstrual cycle: Secondary | ICD-10-CM

## 2023-12-10 DIAGNOSIS — R319 Hematuria, unspecified: Secondary | ICD-10-CM

## 2023-12-25 ENCOUNTER — Encounter
Admit: 2023-12-25 | Payer: PRIVATE HEALTH INSURANCE | Attending: Student in an Organized Health Care Education/Training Program | Primary: Internal Medicine

## 2023-12-25 ENCOUNTER — Ambulatory Visit
Admit: 2023-12-25 | Payer: PRIVATE HEALTH INSURANCE | Attending: Student in an Organized Health Care Education/Training Program | Primary: Internal Medicine

## 2023-12-25 VITALS — BP 134/83 | HR 76 | Ht 61.0 in | Wt 164.5 lb

## 2023-12-25 DIAGNOSIS — R3129 Other microscopic hematuria: Secondary | ICD-10-CM

## 2023-12-25 DIAGNOSIS — R399 Unspecified symptoms and signs involving the genitourinary system: Secondary | ICD-10-CM

## 2023-12-25 DIAGNOSIS — R519 Headache: Secondary | ICD-10-CM

## 2023-12-25 DIAGNOSIS — K760 Fatty (change of) liver, not elsewhere classified: Secondary | ICD-10-CM

## 2023-12-25 DIAGNOSIS — F419 Anxiety disorder, unspecified: Secondary | ICD-10-CM

## 2023-12-25 DIAGNOSIS — K219 Gastro-esophageal reflux disease without esophagitis: Secondary | ICD-10-CM

## 2023-12-25 DIAGNOSIS — H9319 Tinnitus, unspecified ear: Secondary | ICD-10-CM

## 2023-12-25 DIAGNOSIS — F32A Depression: Secondary | ICD-10-CM

## 2023-12-25 DIAGNOSIS — N200 Calculus of kidney: Secondary | ICD-10-CM

## 2023-12-25 NOTE — Progress Notes
 Michelle Barron is here today for a cystoscopy.  Patient informed of procedure and questions answered.  Instructions for pre procedure given to patient.  Written consent and time out obtained.  Patient prepped according to protocol.  Tolerated procedure well.  VSS.  Post procedure instructions reviewed with patient.

## 2023-12-25 NOTE — Progress Notes
 Time Out DocumentationProcedure Name: Cystoscopy Procedure Time: 9:16 AM Team Members Present: Aaralynn Shepheard, RN, Marianne Casilla-Lennon, MDTime Out initiated after patient positioned & prior to beginning of procedure: YesName of Patient  & MRUN # or DOB stated and matches ID band or previously confirmed med record number: YesProceduralist states or confirms procedure to be performed: YesProcedural consent is used to verify procedure performed  & matches pt identifiers:YesSite of procedure(s) (with laterality or level) is topically marked per policy & visible after draping:N/A Final check completed on all specimens: not applicableSerial number on device: [id number]

## 2023-12-25 NOTE — Progress Notes
 [facility]	Urology History and PhysicalHPI: Michelle Barron is a 58 y.o. female with a history of microscopic hematuria, OAB/LUTS, who presents for a cystoscopy.  Last seen by Dr. Ladora Piedmont urgently on 10/30/2023.Prior AVW:[id number] hematuria:- He had a negative hematuria work up 2021.- Contrast [place] done 10/03/23 showed normal kidneys with symmetric enhancement, no stones or hydronephrosis and a normal bladder.- UA in the office 10/23/23 showed 17 RBCs, but her culture was negative.- She urgently presented on 10/29/2023 complaining of vaginal pain and vaginal bleeding.- She requested for a cystoscopy.OAB/LUTS:- She was previously on Vesicare  but was recently switched to Toviaz  for her OAB symptoms.- She is unsure if Toviaz  helped her symptoms.- She was prescribed Myrbetriq  by Dr. Ladora Piedmont on her last visit.Interim:- Last visit she was started on Myrbetriq . Her symptoms of LUTS have gotten better since started on Myrbetriq .- Renal ultrasound on 12/10/2023 revealed a nonobstructing 6 mm left kidney stone in the midpole of the kidney.  No hydronephrosis noted. Note that recent [place] in 09/2023 did not reveal any stones - She is here for a cystoscopy.ROS: A 10 system ROS is negative except per HPIPMH:  has a past medical history of Anxiety, Depression, Fatty liver, GERD (gastroesophageal reflux disease), Headache, and Tinnitus.PSH:  has a past surgical history that includes Appendectomy (20 yrs).Allergies: has No Known Allergies.FH: family history includes Asthma in her father; Ovarian cancer in her maternal aunt.SH:  reports that she has never smoked. She has never used smokeless tobacco. She reports that she does not drink alcohol and does not use drugs.Medications: busPIRone, chlorhexidine gluconate, cholecalciferol  (vitamin D3), clotrimazole -betamethasone , cycloSPORINE , fesoterodine , fluconazole , fluticasone  propionate, gabapentin , hydrocortisone , magnesium  oxide, mirabegron , predniSONE , and tenofovir disoproxilPhysical ExaminationVitals:BP 134/83  - Pulse 76  - Ht 5' 1  - Wt 164 lb 7.4 oz  - BMI 31.08 kg/m? Exam:Gen: Alert, oriented, NAD CV: RR Pulm: Non-labored breathing Abd: soft, NT/ND Ext: WWP GU: No CVATLabs: Lab Results Component Value Date  UCOLOR yellow 12/25/2023  UGLUCOSE Negative 12/25/2023  UKETONE Negative 12/25/2023  USPECGRAVITY 1.010 12/25/2023  UPROTEIN Trace 12/25/2023  UNITRATES Negative 12/25/2023  UBLOOD 2+ 12/25/2023  ULEUKOCYTES Negative 12/25/2023 Lab Results Component Value Date  SPECGRAV 1.028 10/22/2023  PHUR 6.0 10/22/2023  KETONESU Negative 10/22/2023  PROTEINUA Negative 10/22/2023  BLOODU 2+ (A) 10/22/2023  LEUKOCYTESUR Negative 10/22/2023  NITRITE Negative 10/22/2023  BACTERIA Few (A) 10/03/2023 Microbiology:_ Urine culture  Collection Time: 06/17/23  2:30 PM  Specimen: Clean Catch (Voided); Urine Result Value  Urine Culture, Routine    Less than 10,000 CFU/mL. Clinical significance is unlikely for organism(s) present in quantities of less than 10,000 CFU/mL. _ Urine culture  Collection Time: 11/21/22 10:47 AM  Specimen: Clean Catch (Voided); Urine Result Value  Urine Culture, Routine No Growth Radiology:Renal ultrasound on 12/10/2023:IMPRESSION:  Nonobstructing 6 mm left renal stone in the midpole of the kidney.  No hydronephrosis. Right kidney appears unremarkable. Reported and signed by: Margarita V Revzin, MD  [facility] Radiology and Biomedical Imaging02/01/2024 [place] abdomen and pelvis with IV contrast:FINDINGS:Kidneys: Symmetric enhancement.  Small left renal hypodensity cannot be definitively characterized. No evidence for stone or hydronephrosis. Bladder: Within normal limits. IMPRESSION:No definite [place] evidence for acute abnormality. Reported and signed by: Luke Salaam, MD  [facility] Radiology and Biomedical ImagingI reviewed and independently interpreted.Flexible cystoscopy.Indications: LUTS, MicrohematuriaInformed consent was obtained, the patient was prepped and draped in a sterile fashion and a time out was performed to confirm patient identification and procedure. A flexible cystoscope was used to atraumatically enter the bladder. Routine cystoscopy  was performed. Findings:Normal external anatomy.Urethral meatus: normal.Urethra: no evidence of false passages, strictures, foreign bodies, or tumors. Bladder neck: normalTrigone: ureteral orifices are orthotopic; not duplicated, central defect absent.Bladder mucosa: normal: no trabeculations, diverticula, lesions, stones, fistulas, sedimentRetroflexion: wnlComplications: none ASSESSMENT/PLAN:Michelle Barron is a 58 y.o. female with a history of microscopic hematuria, OAB/LUTS, who presents for a cystoscopy.  Last seen by Dr. Ladora Piedmont urgently for LUTS on 10/30/2023.Microscopic hematuria: - POC UA today showed 2+ blood.- Cystoscopy done today was unremarkable.  No discrete etiology of hematuria was identified, but GU malignancy in bladder has been ruled out. Left kidney stone:- Reviewed her most recent renal ultrasound that showed a nonobstructing 6 mm left renal stone in the midpole of the kidney.- Discussed that [place] scan from February 2025 did not show any kidney stone (which is more accurate).  Potentially, the renal ultrasound is showing debris. Would lean towards non-surgical management (monitoring RUS q 6-12 months and if stone grows >1 cm consider Rondo then surgical removal, or if she becomes symptomatic).- Discussed that stone is unrelated to her LUTS.- Advised the patient to continue with conservative strategies to prevent stone formation by staying hydrated (drink 1 gallon of water a day), restricting salt intake, taking low animal protein diet.  Avoid calcium supplements.OAB/LUTS:- Reports that her symptoms are better with Myrbetriq .- Advised her to continue with Myrbetriq  25 mg once a day.- Advised her to continue with conservative strategies.She is already established with Drexel Gentles, NP.  Follow up with Cynthia as scheduled.Patient will let us  know if any new problems or symptoms arise in the interimMicroscopic hematuria, kidney stone, LUTS.  Complex medical condition requiring longitudinal care with 6-12 mo follow up.Orders Placed This Encounter Procedures  POC urinalysis manual w/o scope ___________________________Scribed for Watt Hackney, MD by Pink Bridges, medical scribe April 30, 2025The documentation recorded by the scribe accurately reflects the services I personally performed and the decisions made by me. I reviewed and confirmed all material entered and/or pre-charted by the scribe.

## 2023-12-25 NOTE — Patient Instructions
 Patient Education Cystoscopy Discharge Instructions About this topic Your kidneys make urine. It is stored in your bladder. The urethra is a tube at the bottom of the bladder. Urine flows out of this tube. Sometimes, there is a blockage and urine is not able to leave the body.A cystoscopy is a procedure that lets the doctor see the inside of your bladder and urethra. The doctor does it ZO:XWRU for stones or tumors blocking the bladder and urethraLook for changes or injury inside the bladderTake a tissue sample from the inside of your bladderLook for reasons for blood in the urine, pain with urination, or why you are passing urine oftenLook for prostate problems What care is needed at home? Ask your doctor what you need to do when you go home. Make sure you ask questions if you do not understand what the doctor says. This way you will know what you need to do.Take a warm bath or use a warm wet washcloth over the opening to the urethra. This will help to ease any pain. Do this as needed.Drink 6 to 8 glasses of water a day and 3 to 4 glasses in the first few hours after the procedure to flush out your bladder and reduce irritation.You may see some blood in your urine for a few days. This is normal.Empty your bladder as soon as you feel the need to. Don't delay going to the bathroom. It stretches and weakens the bladder.What follow-up care is needed? Your doctor may ask you to make visits to the office to check on your progress. Be sure to keep these visits.If you had a biopsy, talk with your doctor about the results.What drugs may be needed? The doctor may order drugs EA:VWUJ with painFight an infectionHelp with bladder spasmsWill physical activity be limited? Talk to your doctor about when you may go back to your normal activities like work, driving, or sex.What problems could happen? BleedingInfectionInjury to the bladder and urethraDiscomfort in the urethra areaBurning sensation for a short timeUpset stomachWhen do I need to call the doctor? Signs of infection. These include a fever of 100.4?F (38?C) or higher, chills, pain with passing urine.Pain that does not go away even with drugs or that lasts longer than 2 daysToo much blood in your urinePassing large dime-sized clotsCloudy urineLittle or no urine or not able to pass urineAbdominal pain and nauseaTeach Back: Helping You Understand The Teach Back Method helps you understand the information we are giving you. After you talk with the staff, tell them in your own words what you learned. This helps to make sure the staff has described each thing clearly. It also helps to explain things that may have been confusing. Before going home, make sure you can do these:I can tell you about my procedure.I can tell you what may help ease my pain.I can tell you what I will do if I have a fever, chills, or am not able to pass urine.Where can I learn more? American Cancer Societyhttps://www.cancer.org/treatment/understanding-your-diagnosis/tests/endoscopy/cystoscopy.html Cancer Research ktimeonline.com NHS Choiceshttp://www.nhs.uk/conditions/Cystoscopy/Pages/Introduction.aspx Last Reviewed Date 2021-04-22Consumer Information Use and Disclaimer This generalized information is a limited summary of diagnosis, treatment, and/or medication information. It is not meant to be comprehensive and should be used as a tool to help the user understand and/or assess potential diagnostic and treatment options. It does NOT include all information about conditions, treatments, medications, side effects, or risks that may apply to a specific patient. It is not intended to be medical advice or a substitute for the medical advice, diagnosis, or  treatment of a health care provider based on the health care provider's examination and assessment of a patient?s specific and unique circumstances. Patients must speak with a health care provider for complete information about their health, medical questions, and treatment options, including any risks or benefits regarding use of medications. This information does not endorse any treatments or medications as safe, effective, or approved for treating a specific patient. UpToDate, Inc. and its affiliates disclaim any warranty or liability relating to this information or the use thereof. The use of this information is governed by the Terms of Use, available at https://www.wolterskluwer.com/en/know/clinical-effectiveness-terms Copyright Copyright ? 2023 UpToDate, Inc. and its affiliates and/or licensors. All rights reserved.

## 2023-12-30 ENCOUNTER — Ambulatory Visit: Admit: 2023-12-30 | Payer: PRIVATE HEALTH INSURANCE | Attending: Urology | Primary: Internal Medicine

## 2023-12-30 VITALS — Ht 61.0 in | Wt 165.0 lb

## 2023-12-30 DIAGNOSIS — R3129 Other microscopic hematuria: Principal | ICD-10-CM

## 2023-12-30 NOTE — Progress Notes
 Follow-up Note Michelle Barron is a 58 y.o. female is here for follow up with established diagnosis microscopic hematuria, OAB/LUTS, nephrolithiasisPrior UEA:[id number] hematuria:- She had a negative hematuria work up 2021.- Contrast [place] done 10/03/23 showed normal kidneys with symmetric enhancement, no stones or hydronephrosis and a normal bladder.- UA in the office 10/23/23 showed 17 RBCs, but her culture was negative.- She urgently presented on 10/29/2023 complaining of vaginal pain and vaginal bleeding.- She requested for a cystoscopy.OAB/LUTS:- She was previously on Vesicare  but was recently switched to Toviaz  for her OAB symptoms.- She is unsure if Toviaz  helped her symptoms.- She was prescribed Myrbetriq  Interim:- Last visit she was started on Myrbetriq . Her symptoms of LUTS have gotten better since started on Myrbetriq .- Renal ultrasound on 12/10/2023 revealed a nonobstructing 6 mm left kidney stone in the midpole of the kidney.  No hydronephrosis noted. Note that recent [place] in 09/2023 did not reveal any stones - cystoscopy on 12/25/23 with Dr Rosamond Comes  was negative.ROS: A 10 system ROS is negative except per HPIPMH:  has a past medical history of Anxiety, Depression, Fatty liver, GERD (gastroesophageal reflux disease), Headache, and Tinnitus.PSH:  has a past surgical history that includes Appendectomy (20 yrs).Allergies: has No Known Allergies.FH: family history includes Asthma in her father; Ovarian cancer in her maternal aunt.SH:  reports that she has never smoked. She has never used smokeless tobacco. She reports that she does not drink alcohol and does not use drugs.HPIMaria  reports that she has never smoked. She has never used smokeless tobacco. She reports that she does not drink alcohol and does not use drugs.  Past Medical History: Diagnosis Date  Anxiety   Depression   Fatty liver   GERD (gastroesophageal reflux disease)   Headache   Tinnitus   with headaches Past Surgical History: Procedure Laterality Date  APPENDECTOMY  20 yrs No Known AllergiesReview Of SystemsReview of SystemsGU: See HPIPhysical ExamThere were no vitals taken for this visit.Lab Results Component Value Date  UCOLOR yellow 12/25/2023  UGLUCOSE Negative 12/25/2023  UKETONE Negative 12/25/2023  USPECGRAVITY 1.010 12/25/2023  UPROTEIN Trace 12/25/2023  UNITRATES Negative 12/25/2023  UBLOOD 2+ 12/25/2023  ULEUKOCYTES Negative 12/25/2023    No results found for: PVRPOCTPhysical ExamPhysical Exam Constitutional: oriented to person, place, and time.  appears well-developed and well-nourished. HENT: Head: Normocephalic. Eyes: No scleral icterus. Cardiovascular: Normal rate.  Pulmonary/Chest: Effort normal. Abdominal: Soft. Musculoskeletal: Normal range of motion. Lymphadenopathy:    no cervical adenopathy. Neurological: alert and oriented to person, place, and time. Skin: Skin is warm and dry. Psychiatric: Has a normal mood and affect. Her behavior is normal. Judgment and thought content normal.  Assessment & Plan:Dalphine R Markowicz is a 58 y.o. female No diagnosis found.Laurine Pore, APRN

## 2024-01-01 ENCOUNTER — Ambulatory Visit: Admit: 2024-01-01 | Payer: PRIVATE HEALTH INSURANCE | Attending: Ophthalmology | Primary: Internal Medicine

## 2024-01-01 ENCOUNTER — Encounter: Admit: 2024-01-01 | Payer: PRIVATE HEALTH INSURANCE | Attending: Ophthalmology | Primary: Internal Medicine

## 2024-01-01 DIAGNOSIS — F32A Depression: Secondary | ICD-10-CM

## 2024-01-01 DIAGNOSIS — F419 Anxiety disorder, unspecified: Secondary | ICD-10-CM

## 2024-01-01 DIAGNOSIS — H2513 Age-related nuclear cataract, bilateral: Secondary | ICD-10-CM

## 2024-01-01 DIAGNOSIS — K219 Gastro-esophageal reflux disease without esophagitis: Secondary | ICD-10-CM

## 2024-01-01 DIAGNOSIS — H5203 Hypermetropia, bilateral: Secondary | ICD-10-CM

## 2024-01-01 DIAGNOSIS — R519 Headache: Secondary | ICD-10-CM

## 2024-01-01 DIAGNOSIS — H9319 Tinnitus, unspecified ear: Secondary | ICD-10-CM

## 2024-01-01 DIAGNOSIS — H04123 Dry eye syndrome of bilateral lacrimal glands: Secondary | ICD-10-CM

## 2024-01-01 DIAGNOSIS — K760 Fatty (change of) liver, not elsewhere classified: Secondary | ICD-10-CM

## 2024-01-01 MED ORDER — LIFITEGRAST 5 % EYE DROPS IN A DROPPERETTE
5 | Freq: Two times a day (BID) | OPHTHALMIC | 12 refills | Status: AC
Start: 2024-01-01 — End: ?

## 2024-01-01 NOTE — Progress Notes
 Cataracts both eyes- Mild, NVS- ObserveDry eyes both eyes- Using ATs QID- Getting blurring and burning sensation with restasis - Try xiidra BIDHyperopia with presbyopia- MRx stablePlan:ATs QID, tear gel at bedtimeXiidra BIDRTC 1 yr

## 2024-02-15 ENCOUNTER — Encounter: Admit: 2024-02-15 | Payer: PRIVATE HEALTH INSURANCE | Primary: Internal Medicine

## 2024-02-16 LAB — COMPREHENSIVE METABOLIC PANEL
ALBUMIN/GLOBULIN RATIO: 1.7 (calc) (ref 1.0–2.5)
ALBUMIN: 4.4 g/dL (ref 3.6–5.1)
ALKALINE PHOSPHATASE: 55 U/L (ref 37–153)
ALT (SGPT): 18 U/L (ref 6–29)
AST (SGOT): 18 U/L (ref 10–35)
BILIRUBIN, TOTAL: 0.7 mg/dL (ref 0.2–1.2)
BLOOD UREA NITROGEN: 14 mg/dL (ref 7–25)
BUN / CREAT RATIO: 13 (calc) (ref 6–22)
CALCIUM: 9.3 mg/dL (ref 8.6–10.4)
CHLORIDE: 104 mmol/L (ref 98–110)
CO2: 26 mmol/L (ref 20–32)
CREATININE: 1.07 mg/dL — ABNORMAL HIGH (ref 0.50–1.03)
EGFR, CREATININE (CKD-EPI 2021) QUEST: 61 mL/min/{1.73_m2} (ref >=60)
GLOBULIN: 2.6 g/dL (ref 1.9–3.7)
GLUCOSE: 94 mg/dL (ref 65–99)
POTASSIUM: 4.3 mmol/L (ref 3.5–5.3)
PROTEIN, TOTAL, SPEP: 7 g/dL (ref 6.1–8.1)
SODIUM: 141 mmol/L (ref 135–146)

## 2024-02-16 LAB — CBC AND DIFFERENTIAL
BASOPHILS ABSOLUTE COUNT: 48 {cells}/uL (ref 0–200)
BASOPHILS: 0.7 %
EOSINOPHILS ABSOLUTE COUNT: 90 {cells}/uL (ref 15–500)
EOSINOPHILS: 1.3 %
HEMATOCRIT BLOOD: 43.6 % (ref 35.0–45.0)
HEMOGLOBIN: 13.5 g/dL (ref 11.7–15.5)
LYMPHOCYTES ABSOLUTE COUNT: 3264 {cells}/uL (ref 850–3900)
LYMPHOCYTES: 47.3 %
MCH: 28.6 pg (ref 27.0–33.0)
MCHC-HEMOGLOBINOPATHY: 31 g/dL — ABNORMAL LOW (ref 32.0–36.0)
MCV: 92.4 fL (ref 80.0–100.0)
MONOCYTES ABSOLUTE COUNT: 559 {cells}/uL (ref 200–950)
MONOCYTES: 8.1 %
MPV: 9.7 fL (ref 7.5–12.5)
NEUTROPHILS ABSOLUTE COUNT: 2939 {cells}/uL (ref 1500–7800)
NEUTROPHILS: 42.6 %
PLATELET COUNT: 290 10*3/uL (ref 140–400)
RDW: 12.9 % (ref 11.0–15.0)
RED BLOOD CELL COUNT: 4.72 10*6/uL (ref 3.80–5.10)
WHITE BLOOD CELL COUNT: 6.9 10*3/uL (ref 3.8–10.8)

## 2024-02-16 LAB — HEMOGLOBIN A1C: HEMOGLOBIN A1C: 5.8 % — ABNORMAL HIGH (ref <5.7)

## 2024-02-16 LAB — LIPID PANEL
CHOLESTEROL, TOTAL: 159 mg/dL (ref <200)
CHOLESTEROL/HDL RATIO: 3 (calc) (ref <5.0)
HDL CHOLESTEROL: 53 mg/dL (ref >=50)
LDL CHOLESTEROL: 92 mg/dL
NON-HDL CHOLESTEROL: 106 mg/dL (ref <130)
TRIGLYCERIDES: 53 mg/dL (ref <150)

## 2024-02-16 LAB — TSH W/REFLEX TO FT4     (BH GH LMW Q YH): TSH, 3RD GENERATION W/ REFLEX TO FT4: 0.6 m[IU]/L

## 2024-04-20 ENCOUNTER — Encounter: Admit: 2024-04-20 | Payer: PRIVATE HEALTH INSURANCE | Attending: Obstetrics and Gynecology | Primary: Internal Medicine

## 2024-07-09 ENCOUNTER — Ambulatory Visit: Admit: 2024-07-09 | Payer: PRIVATE HEALTH INSURANCE | Attending: Urology | Primary: Internal Medicine

## 2024-09-02 ENCOUNTER — Encounter: Admit: 2024-09-02 | Payer: PRIVATE HEALTH INSURANCE | Attending: Ophthalmology | Primary: Internal Medicine

## 2025-01-21 ENCOUNTER — Encounter: Admit: 2025-01-21 | Payer: PRIVATE HEALTH INSURANCE | Attending: Ophthalmology | Primary: Internal Medicine
